# Patient Record
Sex: Male | Born: 1990 | Race: White | Hispanic: No | Smoking: Never smoker
Health system: Southern US, Community
[De-identification: ages and names within clinical notes are randomized; demographics above are authoritative.]

## PROBLEM LIST (undated history)

## (undated) DIAGNOSIS — D689 Coagulation defect, unspecified: Secondary | ICD-10-CM

## (undated) DIAGNOSIS — I10 Essential (primary) hypertension: Secondary | ICD-10-CM

## (undated) DIAGNOSIS — I2699 Other pulmonary embolism without acute cor pulmonale: Secondary | ICD-10-CM

## (undated) HISTORY — DX: Essential (primary) hypertension: I10

## (undated) HISTORY — DX: Other pulmonary embolism without acute cor pulmonale: I26.99

## (undated) HISTORY — PX: WISDOM TOOTH EXTRACTION: SHX21

## (undated) HISTORY — DX: Coagulation defect, unspecified: D68.9

---

## 2018-12-22 ENCOUNTER — Other Ambulatory Visit: Payer: Self-pay

## 2018-12-22 ENCOUNTER — Inpatient Hospital Stay (HOSPITAL_COMMUNITY)
Admission: EM | Admit: 2018-12-22 | Discharge: 2018-12-24 | DRG: 176 | Disposition: A | Discharge status: Home / Self Care | Payer: 59 | Attending: Internal Medicine | Admitting: Internal Medicine

## 2018-12-22 ENCOUNTER — Encounter (HOSPITAL_COMMUNITY): Payer: Self-pay | Admitting: Emergency Medicine

## 2018-12-22 ENCOUNTER — Emergency Department (HOSPITAL_COMMUNITY): Payer: 59

## 2018-12-22 DIAGNOSIS — A419 Sepsis, unspecified organism: Secondary | ICD-10-CM | POA: Diagnosis not present

## 2018-12-22 DIAGNOSIS — Z791 Long term (current) use of non-steroidal anti-inflammatories (NSAID): Secondary | ICD-10-CM

## 2018-12-22 DIAGNOSIS — Z79899 Other long term (current) drug therapy: Secondary | ICD-10-CM

## 2018-12-22 DIAGNOSIS — M546 Pain in thoracic spine: Secondary | ICD-10-CM | POA: Diagnosis present

## 2018-12-22 DIAGNOSIS — Z789 Other specified health status: Secondary | ICD-10-CM | POA: Diagnosis present

## 2018-12-22 DIAGNOSIS — I2699 Other pulmonary embolism without acute cor pulmonale: Secondary | ICD-10-CM | POA: Diagnosis present

## 2018-12-22 DIAGNOSIS — I2694 Multiple subsegmental pulmonary emboli without acute cor pulmonale: Secondary | ICD-10-CM | POA: Diagnosis not present

## 2018-12-22 DIAGNOSIS — R05 Cough: Secondary | ICD-10-CM | POA: Diagnosis not present

## 2018-12-22 DIAGNOSIS — R000 Tachycardia, unspecified: Secondary | ICD-10-CM | POA: Diagnosis present

## 2018-12-22 DIAGNOSIS — F1290 Cannabis use, unspecified, uncomplicated: Secondary | ICD-10-CM | POA: Diagnosis present

## 2018-12-22 DIAGNOSIS — J181 Lobar pneumonia, unspecified organism: Secondary | ICD-10-CM | POA: Diagnosis not present

## 2018-12-22 DIAGNOSIS — F1010 Alcohol abuse, uncomplicated: Secondary | ICD-10-CM | POA: Diagnosis present

## 2018-12-22 DIAGNOSIS — Z7289 Other problems related to lifestyle: Secondary | ICD-10-CM | POA: Diagnosis not present

## 2018-12-22 DIAGNOSIS — Z823 Family history of stroke: Secondary | ICD-10-CM

## 2018-12-22 DIAGNOSIS — M898X1 Other specified disorders of bone, shoulder: Secondary | ICD-10-CM | POA: Diagnosis present

## 2018-12-22 DIAGNOSIS — J189 Pneumonia, unspecified organism: Secondary | ICD-10-CM | POA: Diagnosis present

## 2018-12-22 LAB — CBC WITH DIFFERENTIAL/PLATELET
Abs Immature Granulocytes: 0.09 10*3/uL — ABNORMAL HIGH (ref 0.00–0.07)
BASOS PCT: 0 %
Basophils Absolute: 0 10*3/uL (ref 0.0–0.1)
Eosinophils Absolute: 0 10*3/uL (ref 0.0–0.5)
Eosinophils Relative: 0 %
HCT: 44.5 % (ref 39.0–52.0)
Hemoglobin: 14.9 g/dL (ref 13.0–17.0)
Immature Granulocytes: 1 %
Lymphocytes Relative: 11 %
Lymphs Abs: 1.6 10*3/uL (ref 0.7–4.0)
MCH: 29.7 pg (ref 26.0–34.0)
MCHC: 33.5 g/dL (ref 30.0–36.0)
MCV: 88.8 fL (ref 80.0–100.0)
Monocytes Absolute: 1.7 10*3/uL — ABNORMAL HIGH (ref 0.1–1.0)
Monocytes Relative: 12 %
Neutro Abs: 10.7 10*3/uL — ABNORMAL HIGH (ref 1.7–7.7)
Neutrophils Relative %: 76 %
Platelets: 220 10*3/uL (ref 150–400)
RBC: 5.01 MIL/uL (ref 4.22–5.81)
RDW: 12 % (ref 11.5–15.5)
WBC: 14.2 10*3/uL — AB (ref 4.0–10.5)
nRBC: 0 % (ref 0.0–0.2)

## 2018-12-22 LAB — BASIC METABOLIC PANEL
Anion gap: 13 (ref 5–15)
BUN: 22 mg/dL — ABNORMAL HIGH (ref 6–20)
CO2: 24 mmol/L (ref 22–32)
Calcium: 9.4 mg/dL (ref 8.9–10.3)
Chloride: 100 mmol/L (ref 98–111)
Creatinine, Ser: 1.01 mg/dL (ref 0.61–1.24)
GFR calc Af Amer: >60 mL/min (ref >60)
GFR calc non Af Amer: >60 mL/min (ref >60)
Glucose, Bld: 94 mg/dL (ref 70–99)
Potassium: 3.8 mmol/L (ref 3.5–5.1)
SODIUM: 137 mmol/L (ref 135–145)

## 2018-12-22 LAB — RESPIRATORY PANEL BY PCR
Adenovirus: NOT DETECTED
Bordetella pertussis: NOT DETECTED
CORONAVIRUS HKU1-RVPPCR: NOT DETECTED
Chlamydophila pneumoniae: NOT DETECTED
Coronavirus 229E: NOT DETECTED
Coronavirus NL63: NOT DETECTED
Coronavirus OC43: NOT DETECTED
Influenza A: NOT DETECTED
Influenza B: NOT DETECTED
METAPNEUMOVIRUS-RVPPCR: NOT DETECTED
Mycoplasma pneumoniae: NOT DETECTED
PARAINFLUENZA VIRUS 1-RVPPCR: NOT DETECTED
PARAINFLUENZA VIRUS 2-RVPPCR: NOT DETECTED
Parainfluenza Virus 3: NOT DETECTED
Parainfluenza Virus 4: NOT DETECTED
Respiratory Syncytial Virus: NOT DETECTED
Rhinovirus / Enterovirus: NOT DETECTED

## 2018-12-22 LAB — EXPECTORATED SPUTUM ASSESSMENT W GRAM STAIN, RFLX TO RESP C

## 2018-12-22 LAB — RAPID URINE DRUG SCREEN, HOSP PERFORMED
AMPHETAMINES: NOT DETECTED
Barbiturates: NOT DETECTED
Benzodiazepines: NOT DETECTED
Cocaine: NOT DETECTED
Opiates: POSITIVE — AB
Tetrahydrocannabinol: POSITIVE — AB

## 2018-12-22 LAB — TROPONIN I: Troponin I: <0.03 ng/mL (ref <0.03)

## 2018-12-22 LAB — APTT: aPTT: 28 seconds (ref 24–36)

## 2018-12-22 LAB — SEDIMENTATION RATE: Sed Rate: 25 mm/hr — ABNORMAL HIGH (ref 0–16)

## 2018-12-22 LAB — MRSA PCR SCREENING: MRSA BY PCR: NEGATIVE

## 2018-12-22 LAB — C-REACTIVE PROTEIN: CRP: 26.4 mg/dL — ABNORMAL HIGH (ref <1.0)

## 2018-12-22 LAB — HEPARIN LEVEL (UNFRACTIONATED): Heparin Unfractionated: 0.21 IU/mL — ABNORMAL LOW (ref 0.30–0.70)

## 2018-12-22 LAB — PROTIME-INR
INR: 1.1 (ref 0.8–1.2)
Prothrombin Time: 13.6 seconds (ref 11.4–15.2)

## 2018-12-22 LAB — TSH: TSH: 3.551 u[IU]/mL (ref 0.350–4.500)

## 2018-12-22 LAB — PROCALCITONIN: Procalcitonin: <0.1 ng/mL

## 2018-12-22 LAB — BRAIN NATRIURETIC PEPTIDE: B Natriuretic Peptide: 12.9 pg/mL (ref 0.0–100.0)

## 2018-12-22 MED ORDER — LEVALBUTEROL HCL 0.63 MG/3ML IN NEBU
0.6300 mg | INHALATION_SOLUTION | Freq: Four times a day (QID) | RESPIRATORY_TRACT | Status: DC
  Administered 2018-12-22: 0.63 mg via RESPIRATORY_TRACT
  Filled 2018-12-22: qty 3

## 2018-12-22 MED ORDER — HEPARIN (PORCINE) 25000 UT/250ML-% IV SOLN
1650.0000 [IU]/h | INTRAVENOUS | Status: DC
  Administered 2018-12-22: 1650 [IU]/h via INTRAVENOUS
  Filled 2018-12-22: qty 250

## 2018-12-22 MED ORDER — IPRATROPIUM-ALBUTEROL 0.5-2.5 (3) MG/3ML IN SOLN
3.0000 mL | Freq: Once | RESPIRATORY_TRACT | Status: AC
  Administered 2018-12-22: 3 mL via RESPIRATORY_TRACT
  Filled 2018-12-22: qty 3

## 2018-12-22 MED ORDER — BENZONATATE 100 MG PO CAPS: 100.0000 mg | ORAL_CAPSULE | Freq: Three times a day (TID) | ORAL | Status: DC | PRN

## 2018-12-22 MED ORDER — LORAZEPAM 1 MG PO TABS: 1.0000 mg | ORAL_TABLET | Freq: Four times a day (QID) | ORAL | Status: DC | PRN

## 2018-12-22 MED ORDER — DM-GUAIFENESIN ER 30-600 MG PO TB12
1.0000 | ORAL_TABLET | Freq: Two times a day (BID) | ORAL | Status: DC
  Administered 2018-12-22 – 2018-12-24 (×4): 1 via ORAL
  Filled 2018-12-22 (×4): qty 1

## 2018-12-22 MED ORDER — HEPARIN BOLUS VIA INFUSION
2550.0000 [IU] | Freq: Once | INTRAVENOUS | Status: AC
  Administered 2018-12-22: 2550 [IU] via INTRAVENOUS
  Filled 2018-12-22: qty 2550

## 2018-12-22 MED ORDER — IPRATROPIUM BROMIDE 0.02 % IN SOLN
0.5000 mg | Freq: Four times a day (QID) | RESPIRATORY_TRACT | Status: DC
  Administered 2018-12-22: 0.5 mg via RESPIRATORY_TRACT
  Filled 2018-12-22: qty 2.5

## 2018-12-22 MED ORDER — MORPHINE SULFATE (PF) 4 MG/ML IV SOLN
4.0000 mg | INTRAVENOUS | Status: DC | PRN
  Administered 2018-12-22: 4 mg via INTRAVENOUS
  Filled 2018-12-22: qty 1

## 2018-12-22 MED ORDER — FOLIC ACID 1 MG PO TABS
1.0000 mg | ORAL_TABLET | Freq: Every day | ORAL | Status: DC
  Administered 2018-12-22 – 2018-12-24 (×3): 1 mg via ORAL
  Filled 2018-12-22 (×3): qty 1

## 2018-12-22 MED ORDER — SODIUM CHLORIDE 0.9 % IV SOLN
1.0000 g | INTRAVENOUS | Status: DC
  Filled 2018-12-22: qty 10

## 2018-12-22 MED ORDER — LEVALBUTEROL HCL 0.63 MG/3ML IN NEBU: 0.6300 mg | INHALATION_SOLUTION | Freq: Four times a day (QID) | RESPIRATORY_TRACT | Status: DC | PRN

## 2018-12-22 MED ORDER — LORAZEPAM 2 MG/ML IJ SOLN: 1.0000 mg | Freq: Four times a day (QID) | INTRAMUSCULAR | Status: DC | PRN

## 2018-12-22 MED ORDER — DOXYCYCLINE HYCLATE 100 MG PO TABS
100.0000 mg | ORAL_TABLET | Freq: Two times a day (BID) | ORAL | Status: DC
  Filled 2018-12-22: qty 1

## 2018-12-22 MED ORDER — ONDANSETRON HCL 4 MG/2ML IJ SOLN: 4.0000 mg | Freq: Four times a day (QID) | INTRAMUSCULAR | Status: DC | PRN

## 2018-12-22 MED ORDER — SODIUM CHLORIDE (PF) 0.9 % IJ SOLN
INTRAMUSCULAR | Status: AC
  Filled 2018-12-22: qty 50

## 2018-12-22 MED ORDER — IOHEXOL 350 MG/ML SOLN
100.0000 mL | Freq: Once | INTRAVENOUS | Status: AC | PRN
  Administered 2018-12-22: 100 mL via INTRAVENOUS

## 2018-12-22 MED ORDER — ADULT MULTIVITAMIN W/MINERALS CH
1.0000 | ORAL_TABLET | Freq: Every day | ORAL | Status: DC
  Administered 2018-12-22 – 2018-12-24 (×3): 1 via ORAL
  Filled 2018-12-22 (×3): qty 1

## 2018-12-22 MED ORDER — SODIUM CHLORIDE 0.9 % IV SOLN
INTRAVENOUS | Status: DC
  Administered 2018-12-22 – 2018-12-24 (×5): via INTRAVENOUS

## 2018-12-22 MED ORDER — SODIUM CHLORIDE 0.9 % IV SOLN
500.0000 mg | INTRAVENOUS | Status: DC
  Filled 2018-12-22: qty 500

## 2018-12-22 MED ORDER — VITAMIN B-1 100 MG PO TABS
100.0000 mg | ORAL_TABLET | Freq: Every day | ORAL | Status: DC
  Administered 2018-12-22 – 2018-12-24 (×3): 100 mg via ORAL
  Filled 2018-12-22 (×3): qty 1

## 2018-12-22 MED ORDER — LORAZEPAM 2 MG/ML IJ SOLN: 0.0000 mg | Freq: Two times a day (BID) | INTRAMUSCULAR | Status: DC

## 2018-12-22 MED ORDER — TRAMADOL HCL 50 MG PO TABS
50.0000 mg | ORAL_TABLET | Freq: Four times a day (QID) | ORAL | Status: DC | PRN
  Administered 2018-12-22: 50 mg via ORAL
  Filled 2018-12-22 (×2): qty 1

## 2018-12-22 MED ORDER — THIAMINE HCL 100 MG/ML IJ SOLN: 100.0000 mg | Freq: Every day | INTRAMUSCULAR | Status: DC

## 2018-12-22 MED ORDER — SODIUM CHLORIDE 0.9 % IV SOLN
1.0000 g | INTRAVENOUS | Status: DC
  Administered 2018-12-22: 1 g via INTRAVENOUS
  Filled 2018-12-22: qty 10

## 2018-12-22 MED ORDER — LORAZEPAM 2 MG/ML IJ SOLN: 0.0000 mg | Freq: Four times a day (QID) | INTRAMUSCULAR | Status: DC

## 2018-12-22 MED ORDER — LEVALBUTEROL HCL 0.63 MG/3ML IN NEBU
0.6300 mg | INHALATION_SOLUTION | Freq: Three times a day (TID) | RESPIRATORY_TRACT | Status: DC
  Administered 2018-12-23 (×3): 0.63 mg via RESPIRATORY_TRACT
  Filled 2018-12-22 (×3): qty 3

## 2018-12-22 MED ORDER — ACETAMINOPHEN 325 MG PO TABS
650.0000 mg | ORAL_TABLET | Freq: Four times a day (QID) | ORAL | Status: DC | PRN
  Administered 2018-12-23 – 2018-12-24 (×3): 650 mg via ORAL
  Filled 2018-12-22 (×3): qty 2

## 2018-12-22 MED ORDER — IPRATROPIUM BROMIDE 0.02 % IN SOLN
0.5000 mg | Freq: Three times a day (TID) | RESPIRATORY_TRACT | Status: DC
  Administered 2018-12-23 (×3): 0.5 mg via RESPIRATORY_TRACT
  Filled 2018-12-22 (×3): qty 2.5

## 2018-12-22 NOTE — H&P (Signed)
[facility] History and Physical   Patient: David Reid   PCP: Patient, No Pcp Per DOB: [birth year]   DOA: 12/22/2018   DOS: 12/22/2018   DOS: the patient was seen and examined on 12/22/2018  Patient coming from: The patient is coming from home.  Chief Complaint: cough and back pain  HPI: David Reid is a 28 y.o. male with Past medical history of alcohol binging drinking. Patient is with complaints of cough and fever and back pain. Symptoms are ongoing for last 2 weeks. Patient reports that he went to [place], [place] between February 7 and February 10.  After a few days he started having complaints of cough which was progressively worsening.  Sometime last week he went to [place] and after returning from there he started having complaints of fever and shortness of breath.  He started having complaints of fatigue and tiredness as well. He mentions that since Thursday he started having complaints of back pain which is progressively worsening. Patient was unable to walk around on Saturday due to severe pain. On the morning of 12/22/2018 patient had a temperature of 102.1. At which time with worsening of severe back pain and shortness of breath and cough patient was brought to the urgent care. After performing a chest x-ray patient was brought here for further work-up. At the time of my evaluation he reports continues to have shortness of breath, back pain, nausea, fatigue, dizziness.  No diarrhea reported. Patient denies any smoking history, no vaping history. Patient does report that he has in the past been binge drinking but not on a daily basis but since this new year he has not drink heavily.  ED Course: Patient presents with above complaint.  Due to his recent travel history and complaints of back pain a CT chest PE protocol was performed which is equivocal. But it does show presence of left sided pneumonia and patient was referred for admission.  At his  baseline ambulates without support And is independent for most of his ADL; manages his medication on his own.  Review of Systems: as mentioned in the history of present illness.  All other systems reviewed and are negative.  History reviewed. No pertinent past medical history. Past Surgical History:  Procedure Laterality Date  . WISDOM TOOTH EXTRACTION     Social History:  reports that he has never smoked. He has never used smokeless tobacco. He reports current alcohol use. No history on file for drug.  No Known Allergies  No family history on file.  Patient does not have any significant family history.   Prior to Admission medications   Medication Sig Start Date End Date Taking? Authorizing Provider  acetaminophen (TYLENOL) 500 MG tablet Take 1,000 mg by mouth daily as needed for fever.   Yes [provider]  ibuprofen (ADVIL,MOTRIN) 200 MG tablet Take 400 mg by mouth daily as needed for fever.   Yes [provider]  Multiple Vitamin (MULTIVITAMIN WITH MINERALS) TABS tablet Take 1 tablet by mouth daily.   Yes [provider]    Physical Exam: Vitals:   12/22/18 1730 12/22/18 1800 12/22/18 1900 12/22/18 1936  BP: (!) 148/91 (!) 146/89 (!) 144/93 (!) 167/88  Pulse: (!) 118 (!) 115 (!) 115 (!) 122  Resp: (!) 37 (!) 27 (!) 24 (!) 22  Temp:    99.5 F (37.5 C)  TempSrc:    Oral  SpO2: 95% 96% 94% 97%  Weight:  Height:        General: Alert, Awake and Oriented to Time, Place and Person. Appear in severe distress, affect appropriate Eyes: PERRL, Conjunctiva normal ENT: Oral Mucosa clear moist. Neck: no JVD, no Abnormal Mass Or lumps Cardiovascular: S1 and S2 Present, no Murmur, Peripheral Pulses Present Respiratory: increased respiratory effort, Bilateral Air entry equal and Decreased, no use of accessory muscle, bilateral  Crackles, no wheezes Abdomen: Bowel Sound present, Soft and no tenderness, no hernia Skin: no redness, no Rash, nono  induration Extremities: no Pedal edema, no calf tenderness Neurologic: Grossly no focal neuro deficit. Bilaterally Equal motor strength  Labs on Admission:  CBC: Recent Labs  Lab 12/22/18 1206  WBC 14.2*  NEUTROABS 10.7*  HGB 14.9  HCT 44.5  MCV 88.8  PLT 063   Basic Metabolic Panel: Recent Labs  Lab 12/22/18 1206  NA 137  K 3.8  CL 100  CO2 24  GLUCOSE 94  BUN 22*  CREATININE 1.01  CALCIUM 9.4   GFR: Estimated Creatinine Clearance: 127.8 mL/min (by C-G formula based on SCr of 1.01 mg/dL). Liver Function Tests: No results for input(s): AST, ALT, ALKPHOS, BILITOT, PROT, ALBUMIN in the last 168 hours. No results for input(s): LIPASE, AMYLASE in the last 168 hours. No results for input(s): AMMONIA in the last 168 hours. Coagulation Profile: Recent Labs  Lab 12/22/18 1608  INR 1.1   Cardiac Enzymes: Recent Labs  Lab 12/22/18 1206  TROPONINI <0.03   BNP (last 3 results) No results for input(s): PROBNP in the last 8760 hours. HbA1C: No results for input(s): HGBA1C in the last 72 hours. CBG: No results for input(s): GLUCAP in the last 168 hours. Lipid Profile: No results for input(s): CHOL, HDL, LDLCALC, TRIG, CHOLHDL, LDLDIRECT in the last 72 hours. Thyroid Function Tests: No results for input(s): TSH, T4TOTAL, FREET4, T3FREE, THYROIDAB in the last 72 hours. Anemia Panel: No results for input(s): VITAMINB12, FOLATE, FERRITIN, TIBC, IRON, RETICCTPCT in the last 72 hours. Urine analysis: No results found for: COLORURINE, APPEARANCEUR, LABSPEC, PHURINE, GLUCOSEU, HGBUR, BILIRUBINUR, KETONESUR, PROTEINUR, UROBILINOGEN, NITRITE, LEUKOCYTESUR  Radiological Exams on Admission: Ct Angio Chest Pe W/cm &/or Wo Cm  Result Date: 12/22/2018 CLINICAL DATA:  28 year old male with history of persistent cough for the past 3 weeks. Shortness of breath worsening over the past 2 days. EXAM: CT ANGIOGRAPHY CHEST WITH CONTRAST TECHNIQUE: Multidetector CT imaging of the chest was  performed using the standard protocol during bolus administration of intravenous contrast. Multiplanar CT image reconstructions and MIPs were obtained to evaluate the vascular anatomy. CONTRAST:  128m OMNIPAQUE IOHEXOL 350 MG/ML SOLN COMPARISON:  No priors. FINDINGS: Comment: Today's study is severely limited by a large amount of patient respiratory motion. Cardiovascular: There are potential filling defects within segmental sized pulmonary artery branches to the lower lobes of the lungs bilaterally, but assessment is severely limited by extensive patient respiratory motion. No larger central filling defects are noted. Heart size is normal. There is no significant pericardial fluid, thickening or pericardial calcification. Mediastinum/Nodes: No pathologically enlarged mediastinal or hilar lymph nodes. Esophagus is unremarkable in appearance. No axillary lymphadenopathy. Lungs/Pleura: There is a combination of airspace consolidation and ground-glass attenuation throughout the left lower lobe. Some airspace consolidation is also noted in the posterior aspect of the left upper lobe. Volume loss in the right lower lobe. Small left pleural effusion lying dependently. No definite suspicious appearing pulmonary nodules or masses are noted. Upper Abdomen: Unremarkable. Musculoskeletal: There are no aggressive appearing lytic or blastic lesions  noted in the visualized portions of the skeleton. Review of the MIP images confirms the above findings. IMPRESSION: 1. Airspace consolidation and extensive ground-glass attenuation throughout the left lower lobe, and to a lesser extent in the posterior aspect of the left upper lobe. If there are infectious symptoms, this may simply reflect pneumonia. However, there are potential filling defects in the pulmonary arteries extending to this distribution (as well as in the right lower lobe to a lesser extent) which could reflect pulmonary embolism. Unfortunately, today's study is  extremely limited by patient respiratory motion such that definitive diagnosis of pulmonary embolism is not possible. 2. Small left pleural effusion lying dependently. These results were called by telephone at the time of interpretation on 12/22/2018 at 3:41 pm to Dr. Francine Graven, who verbally acknowledged these results. Electronically Signed   By: Vinnie Langton M.D.   On: 12/22/2018 15:45   Assessment/Plan 1. CAP (community acquired pneumonia) Sepsis present on admission Presents with complaints of severe cough and shortness of breath.  Chest x-ray shows extensive groundglass opacification of the left lung zone as well as significant consolidation of left lower lobe. Tachycardic and tachypneic concerning for sepsis. We will provide aggressive IV hydration. Treat with IV ceftriaxone and azithromycin. Follow-up on cultures. Continue with nebulization, Mucinex and other supportive measures as well. We will check ANA ESR CRP to rule out any other acute abnormality which can represent a severe acute lung injury. We will also check respiratory virus pathogen panel.  2.  Suspected pulmonary embolism. Patient presents with tachycardia and and back pain. CT scan of the chest is equivocal about filling defect. Patient is unable to tolerate CT PE protocol right now. Would like to repeat CT evaluation to rule out PE. Currently empirically on heparin. We will check lower externally Doppler as well as echocardiogram. D-dimer will be falsely elevated.  3.  History of alcohol abuse. Patient does have significant history of alcohol abuse. Does not think the patient is actually does not appear to be having any withdrawals right now. Last drink was last week. We will monitor him on CIWA protocol.  Nutrition: regular diet DVT Prophylaxis: subcutaneous Heparin  Advance goals of care discussion: full code   Consults: none  Family Communication: family was present at bedside, at the time of  interview.  Opportunity was given to ask question and all questions were answered satisfactorily.  Disposition: Admitted as inpatient, telemetry unit. Likely to be discharged home, in 3-4 days.  Author: Berle Mull, MD [facility] 12/22/2018  To reach On-call, see care teams to locate the attending and reach out to them via [email or web address]. If 7PM-7AM, please contact night-coverage If you still have difficulty reaching the attending provider, please page the [facility] (Director on Call) for [facility] on amion for assistance.

## 2018-12-22 NOTE — ED Provider Notes (Signed)
[facility]-EMERGENCY DEPT Provider Note   CSN: [id number] Arrival date & time: 12/22/18  1791    History   Chief Complaint Chief Complaint  Patient presents with  . Cough  . Back Pain  . Shoulder Pain    HPI David Reid is a 28 y.o. male.     HPI  Pt was seen at 1155. Per pt, c/o gradual onset and worsening of persistent cough for the past 2 to 3 weeks. Has been associated with home fevers to "102." Pt states he has recently travelled to [place] and [place], but his symptoms were present before his first trip, and worsened after his 2nd trip. Pt states he is now having constant thoracic back "pain" for the past 2 days, which worsens with deep breath and coughing. Pt was evaluated at [facility] with CXR, then told to come to the ED for further evaluation. Denies CP/palpitations, no abd pain, no N/V/D, no rash, no injury, no calf/LE pain or unilateral swelling.    History reviewed. No pertinent past medical history.  There are no active problems to display for this patient.   Past Surgical History:  Procedure Laterality Date  . WISDOM TOOTH EXTRACTION          Home Medications    Prior to Admission medications   Medication Sig Start Date End Date Taking? Authorizing Provider  acetaminophen (TYLENOL) 500 MG tablet Take 1,000 mg by mouth daily as needed for fever.   Yes [provider]  ibuprofen (ADVIL,MOTRIN) 200 MG tablet Take 400 mg by mouth daily as needed for fever.   Yes [provider]  Multiple Vitamin (MULTIVITAMIN WITH MINERALS) TABS tablet Take 1 tablet by mouth daily.   Yes [provider]    Family History No family history on file.  Social History Social History   Tobacco Use  . Smoking status: Never Smoker  . Smokeless tobacco: Never Used  Substance Use Topics  . Alcohol use: Yes  . Drug use: Not on file     Allergies   Patient has no known allergies.   Review of Systems Review of  Systems ROS: Statement: All systems negative except as marked or noted in the HPI; Constitutional: +fever and chills. ; ; Eyes: Negative for eye pain, redness and discharge. ; ; ENMT: Negative for ear pain, hoarseness, nasal congestion, sinus pressure and sore throat. ; ; Cardiovascular: Negative for chest pain, palpitations, diaphoresis, dyspnea and peripheral edema. ; ; Respiratory: +cough. Negative for wheezing and stridor. ; ; Gastrointestinal: Negative for nausea, vomiting, diarrhea, abdominal pain, blood in stool, hematemesis, jaundice and rectal bleeding. . ; ; Genitourinary: Negative for dysuria, flank pain and hematuria. ; ; Musculoskeletal: +back pain. Negative for neck pain. Negative for swelling and trauma.; ; Skin: Negative for pruritus, rash, abrasions, blisters, bruising and skin lesion.; ; Neuro: Negative for headache, lightheadedness and neck stiffness. Negative for weakness, altered level of consciousness, altered mental status, extremity weakness, paresthesias, involuntary movement, seizure and syncope.       Physical Exam Updated Vital Signs BP (!) 144/91 (BP Location: Left Arm)   Pulse (!) 108   Temp 99.6 F (37.6 C) (Oral)   Resp 14   SpO2 100%    Patient Vitals for the past 24 hrs:  BP Temp Temp src Pulse Resp SpO2  12/22/18 1441 (!) 144/91 99.6 F (37.6 C) Oral - 14 -  12/22/18 1005 (!) 160/95 98.2 F (36.8 C) Oral (!) 108 19 100 %  Physical Exam 1200: Physical examination:  Nursing notes reviewed; Vital signs and O2 SAT reviewed;  Constitutional: Well developed, Well nourished, Well hydrated, Uncomfortable appearing; Head:  Normocephalic, atraumatic; Eyes: EOMI, PERRL, No scleral icterus; ENMT: Mouth and pharynx normal, Mucous membranes moist; Neck: Supple, Full range of motion, No lymphadenopathy; Cardiovascular: Tachycardic rate and rhythm, No gallop; Respiratory: Breath sounds clear & equal bilaterally, No wheezes.  Speaking full sentences with ease, Normal  respiratory effort/excursion; Chest: Nontender, Movement normal; Abdomen: Soft, Nontender, Nondistended, Normal bowel sounds; Genitourinary: No CVA tenderness; Spine:  No midline CS, TS, LS tenderness. +TTP bilat thoracic paraspinal muscles.;; Extremities: Peripheral pulses normal, No tenderness, No edema, No calf edema or asymmetry.; Neuro: AA&Ox3, Major CN grossly intact.  Speech clear. No gross focal motor or sensory deficits in extremities. Climbs on and off stretcher easily by himself. Gait steady..; Skin: Color normal, Warm, Dry.     ED Treatments / Results  Labs (all labs ordered are listed, but only abnormal results are displayed)   EKG EKG Interpretation  Date/Time:  Monday December 22 2018 14:02:15 EST Ventricular Rate:  113 PR Interval:    QRS Duration: 105 QT Interval:  309 QTC Calculation: 424 R Axis:   83 Text Interpretation:  Sinus tachycardia Nonspecific T abnormalities, diffuse leads Artifact No old tracing to compare Confirmed by Samuel Jester [phone]) on 12/22/2018 2:29:24 PM   Radiology   Procedures Procedures (including critical care time)  Medications Ordered in ED Medications  sodium chloride (PF) 0.9 % injection (has no administration in time range)  ipratropium-albuterol (DUONEB) 0.5-2.5 (3) MG/3ML nebulizer solution 3 mL (3 mLs Nebulization Given 12/22/18 1244)  iohexol (OMNIPAQUE) 350 MG/ML injection 100 mL (100 mLs Intravenous Contrast Given 12/22/18 1517)     Initial Impression / Assessment and Plan / ED Course  I have reviewed the triage vital signs and the nursing notes.  Pertinent labs & imaging results that were available during my care of the patient were reviewed by me and considered in my medical decision making (see chart for details).     MDM Reviewed: previous chart, nursing note and vitals Interpretation: labs, ECG and CT scan Total time providing critical care: 30-74 minutes. This excludes time spent performing separately reportable  procedures and services. Consults: admitting MD    CRITICAL CARE Performed by: Samuel Jester Total critical care time: 35 minutes Critical care time was exclusive of separately billable procedures and treating other patients. Critical care was necessary to treat or prevent imminent or life-threatening deterioration. Critical care was time spent personally by me on the following activities: development of treatment plan with patient and/or surrogate as well as nursing, discussions with consultants, evaluation of patient's response to treatment, examination of patient, obtaining history from patient or surrogate, ordering and performing treatments and interventions, ordering and review of laboratory studies, ordering and review of radiographic studies, pulse oximetry and re-evaluation of patient's condition.   Results for orders placed or performed during the hospital encounter of 12/22/18  Basic metabolic panel  Result Value Ref Range   Sodium 137 135 - 145 mmol/L   Potassium 3.8 3.5 - 5.1 mmol/L   Chloride 100 98 - 111 mmol/L   CO2 24 22 - 32 mmol/L   Glucose, Bld 94 70 - 99 mg/dL   BUN 22 (H) 6 - 20 mg/dL   Creatinine, Ser 0.76 0.61 - 1.24 mg/dL   Calcium 9.4 8.9 - 80.8 mg/dL   GFR calc non Af Amer >60 >60 mL/min   GFR  calc Af Amer >60 >60 mL/min   Anion gap 13 5 - 15  CBC with Differential  Result Value Ref Range   WBC 14.2 (H) 4.0 - 10.5 K/uL   RBC 5.01 4.22 - 5.81 MIL/uL   Hemoglobin 14.9 13.0 - 17.0 g/dL   HCT 09.6 04.5 - 40.9 %   MCV 88.8 80.0 - 100.0 fL   MCH 29.7 26.0 - 34.0 pg   MCHC 33.5 30.0 - 36.0 g/dL   RDW 81.1 91.4 - 78.2 %   Platelets 220 150 - 400 K/uL   nRBC 0.0 0.0 - 0.2 %   Neutrophils Relative % 76 %   Neutro Abs 10.7 (H) 1.7 - 7.7 K/uL   Lymphocytes Relative 11 %   Lymphs Abs 1.6 0.7 - 4.0 K/uL   Monocytes Relative 12 %   Monocytes Absolute 1.7 (H) 0.1 - 1.0 K/uL   Eosinophils Relative 0 %   Eosinophils Absolute 0.0 0.0 - 0.5 K/uL   Basophils  Relative 0 %   Basophils Absolute 0.0 0.0 - 0.1 K/uL   Immature Granulocytes 1 %   Abs Immature Granulocytes 0.09 (H) 0.00 - 0.07 K/uL  Troponin I - Once  Result Value Ref Range   Troponin I <0.03 <0.03 ng/mL   Ct Angio Chest Pe W/cm &/or Wo Cm Result Date: 12/22/2018 CLINICAL DATA:  28 year old male with history of persistent cough for the past 3 weeks. Shortness of breath worsening over the past 2 days. EXAM: CT ANGIOGRAPHY CHEST WITH CONTRAST TECHNIQUE: Multidetector CT imaging of the chest was performed using the standard protocol during bolus administration of intravenous contrast. Multiplanar CT image reconstructions and MIPs were obtained to evaluate the vascular anatomy. CONTRAST:  OMNIPAQUE IOHEXOL 350 MG/ML SOLN COMPARISON:  No priors. FINDINGS: Comment: Today's study is severely limited by a large amount of patient respiratory motion. Cardiovascular: There are potential filling defects within segmental sized pulmonary artery branches to the lower lobes of the lungs bilaterally, but assessment is severely limited by extensive patient respiratory motion. No larger central filling defects are noted. Heart size is normal. There is no significant pericardial fluid, thickening or pericardial calcification. Mediastinum/Nodes: No pathologically enlarged mediastinal or hilar lymph nodes. Esophagus is unremarkable in appearance. No axillary lymphadenopathy. Lungs/Pleura: There is a combination of airspace consolidation and ground-glass attenuation throughout the left lower lobe. Some airspace consolidation is also noted in the posterior aspect of the left upper lobe. Volume loss in the right lower lobe. Small left pleural effusion lying dependently. No definite suspicious appearing pulmonary nodules or masses are noted. Upper Abdomen: Unremarkable. Musculoskeletal: There are no aggressive appearing lytic or blastic lesions noted in the visualized portions of the skeleton. Review of the MIP images  confirms the above findings. IMPRESSION: 1. Airspace consolidation and extensive ground-glass attenuation throughout the left lower lobe, and to a lesser extent in the posterior aspect of the left upper lobe. If there are infectious symptoms, this may simply reflect pneumonia. However, there are potential filling defects in the pulmonary arteries extending to this distribution (as well as in the right lower lobe to a lesser extent) which could reflect pulmonary embolism. Unfortunately, today's study is extremely limited by patient respiratory motion such that definitive diagnosis of pulmonary embolism is not possible. 2. Small left pleural effusion lying dependently. These results were called by telephone at the time of interpretation on 12/22/2018 at 3:41 pm to Dr. Samuel Jester, who verbally acknowledged these results. Electronically Signed   By: Trudie Reed  M.D.   On: 12/22/2018 15:45     1610:  CXR at [facility] with "elevated left hemidiaphragm" and pt sent to ED with expectations of obtaining CT scan.  Pt with risk factors for PE for CT-A obtained and is as above.  Will start IV heparin and IV abx. Pt will need admission and repeat CT-A tomorrow to r/o PE. Pt continues tachycardic and unable to take deep breath even after short neb given. Dx and testing d/w pt and family.  Questions answered.  Verb understanding, agreeable to admit.  T/C returned from [facility]. Allena Katz, case discussed, including:  HPI, pertinent PM/SHx, VS/PE, dx testing, ED course and treatment:  Agreeable to come to ED for evaluation for admission.     Final Clinical Impressions(s) / ED Diagnoses   Final diagnoses:  None    ED Discharge Orders    None       Samuel Jester, DO 12/27/18 1728

## 2018-12-22 NOTE — ED Notes (Signed)
ED TO INPATIENT HANDOFF REPORT  ED Nurse Name and Phone #: [phone]   S Name/Age/Gender David Reid 28 y.o. male Room/Bed: WA02/WA02  Code Status   Code Status: Full Code  Home/SNF/Other Home Patient oriented to: self, place, time and situation Is this baseline? Yes   Triage Complete: Triage complete  Chief Complaint Rib/Chest Pain; Sent from Urgent  Triage Note Pt reports had productive cough, fever, congestion for few weeks. Pt c/o back and shoulder pain started on Saturday that is worse with coughing or taking deep breaths.    Allergies No Known Allergies  Level of Care/Admitting Diagnosis ED Disposition    ED Disposition Condition Comment   Admit  Hospital Area: [facility] [[id number]]  Level of Care: Telemetry [5]  Admit to tele based on following criteria: Complex arrhythmia (Bradycardia/Tachycardia)  Diagnosis: CAP (community acquired pneumonia) [[id number]]  Admitting Physician: Rolly Salter [[id number]]  Attending Physician: Rolly Salter [[id number]]  Estimated length of stay: past midnight tomorrow  Certification:: I certify this patient will need inpatient services for at least 2 midnights  PT Class (Do Not Modify): Inpatient [101]  PT Acc Code (Do Not Modify): Private [1]       B Medical/Surgery History History reviewed. No pertinent past medical history. Past Surgical History:  Procedure Laterality Date  . WISDOM TOOTH EXTRACTION       A IV Location/Drains/Wounds Patient Lines/Drains/Airways Status   Active Line/Drains/Airways    Name:   Placement date:   Placement time:   Site:   Days:   Peripheral IV 12/22/18 Right Antecubital   12/22/18    1230    Antecubital   less than 1   Peripheral IV 12/22/18 Right Forearm   12/22/18    1630    Forearm   less than 1          Intake/Output Last 24 hours  Intake/Output Summary (Last 24 hours) at 12/22/2018 1837 Last data filed at 12/22/2018 1809 Gross per 24 hour  Intake 100 ml   Output -  Net 100 ml    Labs/Imaging Results for orders placed or performed during the hospital encounter of 12/22/18 (from the past 48 hour(s))  Basic metabolic panel     Status: Abnormal   Collection Time: 12/22/18 12:06 PM  Result Value Ref Range   Sodium 137 135 - 145 mmol/L   Potassium 3.8 3.5 - 5.1 mmol/L   Chloride 100 98 - 111 mmol/L   CO2 24 22 - 32 mmol/L   Glucose, Bld 94 70 - 99 mg/dL   BUN 22 (H) 6 - 20 mg/dL   Creatinine, Ser 0.96 0.61 - 1.24 mg/dL   Calcium 9.4 8.9 - 28.3 mg/dL   GFR calc non Af Amer >60 >60 mL/min   GFR calc Af Amer >60 >60 mL/min   Anion gap 13 5 - 15    Comment: Performed at [facility], [address] [place], [place] [address]  CBC with Differential     Status: Abnormal   Collection Time: 12/22/18 12:06 PM  Result Value Ref Range   WBC 14.2 (H) 4.0 - 10.5 K/uL   RBC 5.01 4.22 - 5.81 MIL/uL   Hemoglobin 14.9 13.0 - 17.0 g/dL   HCT 76.5 46.5 - 03.5 %   MCV 88.8 80.0 - 100.0 fL   MCH 29.7 26.0 - 34.0 pg   MCHC 33.5 30.0 - 36.0 g/dL   RDW 46.5 68.1 - 27.5 %  Platelets 220 150 - 400 K/uL   nRBC 0.0 0.0 - 0.2 %   Neutrophils Relative % 76 %   Neutro Abs 10.7 (H) 1.7 - 7.7 K/uL   Lymphocytes Relative 11 %   Lymphs Abs 1.6 0.7 - 4.0 K/uL   Monocytes Relative 12 %   Monocytes Absolute 1.7 (H) 0.1 - 1.0 K/uL   Eosinophils Relative 0 %   Eosinophils Absolute 0.0 0.0 - 0.5 K/uL   Basophils Relative 0 %   Basophils Absolute 0.0 0.0 - 0.1 K/uL   Immature Granulocytes 1 %   Abs Immature Granulocytes 0.09 (H) 0.00 - 0.07 K/uL    Comment: Performed at [facility], [address] [place], [place] [address]  Troponin I - Once     Status: None   Collection Time: 12/22/18 12:06 PM  Result Value Ref Range   Troponin I <0.03 <0.03 ng/mL    Comment: Performed at [facility], [address], [place], [place] [address]   Ct Angio Chest Pe W/cm &/or Wo Cm  Result Date: 12/22/2018 CLINICAL  DATA:  28 year old male with history of persistent cough for the past 3 weeks. Shortness of breath worsening over the past 2 days. EXAM: CT ANGIOGRAPHY CHEST WITH CONTRAST TECHNIQUE: Multidetector CT imaging of the chest was performed using the standard protocol during bolus administration of intravenous contrast. Multiplanar CT image reconstructions and MIPs were obtained to evaluate the vascular anatomy. CONTRAST:  OMNIPAQUE IOHEXOL 350 MG/ML SOLN COMPARISON:  No priors. FINDINGS: Comment: Today's study is severely limited by a large amount of patient respiratory motion. Cardiovascular: There are potential filling defects within segmental sized pulmonary artery branches to the lower lobes of the lungs bilaterally, but assessment is severely limited by extensive patient respiratory motion. No larger central filling defects are noted. Heart size is normal. There is no significant pericardial fluid, thickening or pericardial calcification. Mediastinum/Nodes: No pathologically enlarged mediastinal or hilar lymph nodes. Esophagus is unremarkable in appearance. No axillary lymphadenopathy. Lungs/Pleura: There is a combination of airspace consolidation and ground-glass attenuation throughout the left lower lobe. Some airspace consolidation is also noted in the posterior aspect of the left upper lobe. Volume loss in the right lower lobe. Small left pleural effusion lying dependently. No definite suspicious appearing pulmonary nodules or masses are noted. Upper Abdomen: Unremarkable. Musculoskeletal: There are no aggressive appearing lytic or blastic lesions noted in the visualized portions of the skeleton. Review of the MIP images confirms the above findings. IMPRESSION: 1. Airspace consolidation and extensive ground-glass attenuation throughout the left lower lobe, and to a lesser extent in the posterior aspect of the left upper lobe. If there are infectious symptoms, this may simply reflect pneumonia. However,  there are potential filling defects in the pulmonary arteries extending to this distribution (as well as in the right lower lobe to a lesser extent) which could reflect pulmonary embolism. Unfortunately, today's study is extremely limited by patient respiratory motion such that definitive diagnosis of pulmonary embolism is not possible. 2. Small left pleural effusion lying dependently. These results were called by telephone at the time of interpretation on 12/22/2018 at 3:41 pm to Dr. Samuel Jester, who verbally acknowledged these results. Electronically Signed   By: Trudie Reed M.D.   On: 12/22/2018 15:45    Pending Labs Unresulted Labs (From admission, onward)    Start     Ordered   12/23/18 0500  Procalcitonin  Daily,   R     12/22/18 1743  12/23/18 0500  CBC  Daily,   R     12/22/18 1810   12/22/18 2330  Heparin level (unfractionated)  Once-Timed,   R     12/22/18 1810   12/22/18 1743  MRSA PCR Screening  Once,   R     12/22/18 1743   12/22/18 1743  Urine rapid drug screen (hosp performed)  ONCE - STAT,   R     12/22/18 1743   12/22/18 1743  ANA, IFA (with reflex)  Once,   R     12/22/18 1743   12/22/18 1743  Sedimentation rate  Once,   R     12/22/18 1743   12/22/18 1743  C-reactive protein  Once,   R     12/22/18 1743   12/22/18 1743  Procalcitonin - Baseline  ONCE - STAT,   STAT     12/22/18 1743   12/22/18 1739  TSH  Once,   R     12/22/18 1743   12/22/18 1739  Brain natriuretic peptide  Once,   R     12/22/18 1743   12/22/18 1723  Culture, sputum-assessment  Once,   R     12/22/18 1743   12/22/18 1723  Gram stain  Once,   R     12/22/18 1743   12/22/18 1723  HIV antibody (Routine Screening)  Once,   R     12/22/18 1743   12/22/18 1723  Strep pneumoniae urinary antigen  Once,   R     12/22/18 1743   12/22/18 1632  Respiratory Panel by PCR  (Respiratory virus panel with precautions)  Once,   R     12/22/18 1632   12/22/18 1608  Protime-INR  ONCE - STAT,   R      12/22/18 1608   12/22/18 1608  APTT  ONCE - STAT,   R     12/22/18 1608   12/22/18 1552  Culture, blood (routine x 2) Call MD if unable to obtain prior to antibiotics being given  BLOOD CULTURE X 2,   R    Comments:  If blood cultures drawn in Emergency Department - Do not draw and cancel order    12/22/18 1551          Vitals/Pain Today's Vitals   12/22/18 1441 12/22/18 1639 12/22/18 1708 12/22/18 1721  BP: (!) 144/91  (!) 159/79   Pulse:   (!) 121   Resp: 14  (!) 24   Temp: 99.6 F (37.6 C)     TempSrc: Oral     SpO2:   94%   Weight:  99.8 kg    Height:  5\' 10"  (1.778 m)    PainSc:    5     Isolation Precautions Droplet precaution  Medications Medications  sodium chloride (PF) 0.9 % injection (has no administration in time range)  heparin bolus via infusion 2,550 Units (2,550 Units Intravenous Bolus from Bag 12/22/18 1723)    Followed by  heparin ADULT infusion 100 units/mL (25000 units/230mL sodium chloride 0.45%) (1,650 Units/hr Intravenous New Bag/Given 12/22/18 1723)  0.9 %  sodium chloride infusion (has no administration in time range)  ipratropium (ATROVENT) nebulizer solution 0.5 mg (has no administration in time range)  levalbuterol (XOPENEX) nebulizer solution 0.63 mg (has no administration in time range)  dextromethorphan-guaiFENesin (MUCINEX DM) 30-600 MG per 12 hr tablet 1 tablet (has no administration in time range)  benzonatate (TESSALON) capsule 100 mg (has no administration in  time range)  traMADol (ULTRAM) tablet 50 mg (has no administration in time range)  ondansetron (ZOFRAN) injection 4 mg (has no administration in time range)  acetaminophen (TYLENOL) tablet 650 mg (has no administration in time range)  cefTRIAXone (ROCEPHIN) 1 g in sodium chloride 0.9 % 100 mL IVPB (has no administration in time range)  azithromycin (ZITHROMAX) 500 mg in sodium chloride 0.9 % 250 mL IVPB (has no administration in time range)  ipratropium-albuterol (DUONEB) 0.5-2.5 (3)  MG/3ML nebulizer solution 3 mL (3 mLs Nebulization Given 12/22/18 1244)  iohexol (OMNIPAQUE) 350 MG/ML injection 100 mL (100 mLs Intravenous Contrast Given 12/22/18 1517)    Mobility walks Low fall risk   Focused Assessments Cardiac Assessment Handoff:    Lab Results  Component Value Date   TROPONINI <0.03 12/22/2018   No results found for: DDIMER Does the Patient currently have chest pain? No     R Recommendations: See Admitting Provider Note  Report given to:   Additional Notes:

## 2018-12-22 NOTE — Progress Notes (Addendum)
ANTICOAGULATION CONSULT NOTE - Initial Consult  Pharmacy Consult for Heparin Indication: pulmonary embolus  No Known Allergies  Patient Measurements:   HEPARIN DW (KG): 93.8  Vital Signs: Temp: 99.6 F (37.6 C) (03/02 1441) Temp Source: Oral (03/02 1441) BP: 144/91 (03/02 1441) Pulse Rate: 108 (03/02 1005)  Labs: Recent Labs    12/22/18 1206  HGB 14.9  HCT 44.5  PLT 220  CREATININE 1.01  TROPONINI <0.03    CrCl cannot be calculated (Unknown ideal weight.).   Medical History: History reviewed. No pertinent past medical history.  Medications:  Infusions:  . cefTRIAXone (ROCEPHIN)  IV      Assessment: 28 yo M with recent travel presents with persistent cough & fevers.  Chest CT inconclusive for PNA vs PE.  Pharmacy consulted to start heparin until repeat chest CT completed to determine if patient has PE.  Baseline CBC WNL.    Goal of Therapy:  Heparin level 0.3-0.7 units/ml Monitor platelets by anticoagulation protocol: Yes   Plan:  Check baseline PT/INR, aPTT Give 2550 units bolus x 1 Start heparin infusion at 1650 units/hr Check anti-Xa level in 6 hours and daily while on heparin Continue to monitor H&H and platelets  Elson Clan 12/22/2018,4:02 PM

## 2018-12-22 NOTE — ED Triage Notes (Signed)
Pt reports had productive cough, fever, congestion for few weeks. Pt c/o back and shoulder pain started on Saturday that is worse with coughing or taking deep breaths.

## 2018-12-22 NOTE — Progress Notes (Signed)
ANTICOAGULATION CONSULT NOTE -  Consult  Pharmacy Consult for Heparin Indication: pulmonary embolus  No Known Allergies  Patient Measurements: Height: 5\' 10"  (177.8 cm) Weight: 220 lb (99.8 kg) IBW/kg (Calculated) : 73 HEPARIN DW (KG): 93.8  Vital Signs: Temp: 99.5 F (37.5 C) (03/02 1936) Temp Source: Oral (03/02 1936) BP: 141/86 (03/02 2111) Pulse Rate: 111 (03/02 2243)  Labs: Recent Labs    12/22/18 1206 12/22/18 1608 12/22/18 2319  HGB 14.9  --   --   HCT 44.5  --   --   PLT 220  --   --   APTT  --  28  --   LABPROT  --  13.6  --   INR  --  1.1  --   HEPARINUNFRC  --   --  0.21*  CREATININE 1.01  --   --   TROPONINI <0.03  --   --     Estimated Creatinine Clearance: 127.8 mL/min (by C-G formula based on SCr of 1.01 mg/dL).   Medical History: History reviewed. No pertinent past medical history.  Medications:  Infusions:  . sodium chloride 125 mL/hr at 12/22/18 1944  . azithromycin    . [START ON 12/23/2018] cefTRIAXone (ROCEPHIN)  IV    . heparin 1,650 Units/hr (12/22/18 1723)    Assessment: 28 yo M with recent travel presents with persistent cough & fevers.  Chest CT inconclusive for PNA vs PE.  Pharmacy consulted to start heparin until repeat chest CT completed to determine if patient has PE.  Baseline CBC WNL.  Today, 3/2 2319 HL = 0.21 below goal, no infusion or bleeding issues per RN.   Goal of Therapy:  Heparin level 0.3-0.7 units/ml Monitor platelets by anticoagulation protocol: Yes   Plan:  Give Heparin 2000 unit rebolus Increase heparin drip to 1900 units/hr Daily CBC and HL Check next HL in 6 hours   Lorenza Evangelist 12/22/2018,11:52 PM

## 2018-12-23 ENCOUNTER — Inpatient Hospital Stay (HOSPITAL_COMMUNITY): Payer: 59

## 2018-12-23 DIAGNOSIS — I2699 Other pulmonary embolism without acute cor pulmonale: Secondary | ICD-10-CM

## 2018-12-23 DIAGNOSIS — R000 Tachycardia, unspecified: Secondary | ICD-10-CM

## 2018-12-23 DIAGNOSIS — I2694 Multiple subsegmental pulmonary emboli without acute cor pulmonale: Secondary | ICD-10-CM

## 2018-12-23 LAB — ECHOCARDIOGRAM COMPLETE
Height: 70 in
Weight: 3520 oz

## 2018-12-23 LAB — CBC
HCT: 41.5 % (ref 39.0–52.0)
Hemoglobin: 13.5 g/dL (ref 13.0–17.0)
MCH: 29.9 pg (ref 26.0–34.0)
MCHC: 32.5 g/dL (ref 30.0–36.0)
MCV: 92 fL (ref 80.0–100.0)
PLATELETS: 228 10*3/uL (ref 150–400)
RBC: 4.51 MIL/uL (ref 4.22–5.81)
RDW: 12.1 % (ref 11.5–15.5)
WBC: 10.5 10*3/uL (ref 4.0–10.5)
nRBC: 0 % (ref 0.0–0.2)

## 2018-12-23 LAB — ANTITHROMBIN III: ANTITHROMB III FUNC: 78 % (ref 75–120)

## 2018-12-23 LAB — PROCALCITONIN: Procalcitonin: <0.1 ng/mL

## 2018-12-23 LAB — HEPARIN LEVEL (UNFRACTIONATED): Heparin Unfractionated: 0.39 IU/mL (ref 0.30–0.70)

## 2018-12-23 LAB — HIV ANTIBODY (ROUTINE TESTING W REFLEX): HIV Screen 4th Generation wRfx: NONREACTIVE

## 2018-12-23 MED ORDER — AZITHROMYCIN 250 MG PO TABS
500.0000 mg | ORAL_TABLET | Freq: Every day | ORAL | Status: DC
  Administered 2018-12-23 – 2018-12-24 (×2): 500 mg via ORAL
  Filled 2018-12-23 (×2): qty 2

## 2018-12-23 MED ORDER — HEPARIN (PORCINE) 25000 UT/250ML-% IV SOLN
1900.0000 [IU]/h | INTRAVENOUS | Status: AC
  Administered 2018-12-23 – 2018-12-24 (×3): 1900 [IU]/h via INTRAVENOUS
  Filled 2018-12-23 (×3): qty 250

## 2018-12-23 MED ORDER — IPRATROPIUM BROMIDE 0.02 % IN SOLN: 0.5000 mg | Freq: Four times a day (QID) | RESPIRATORY_TRACT | Status: DC | PRN

## 2018-12-23 MED ORDER — SODIUM CHLORIDE (PF) 0.9 % IJ SOLN
INTRAMUSCULAR | Status: AC
  Filled 2018-12-23: qty 50

## 2018-12-23 MED ORDER — HEPARIN BOLUS VIA INFUSION
2000.0000 [IU] | Freq: Once | INTRAVENOUS | Status: AC
  Administered 2018-12-23: 2000 [IU] via INTRAVENOUS
  Filled 2018-12-23: qty 2000

## 2018-12-23 MED ORDER — IOHEXOL 350 MG/ML SOLN
100.0000 mL | Freq: Once | INTRAVENOUS | Status: AC | PRN
  Administered 2018-12-23: 100 mL via INTRAVENOUS

## 2018-12-23 NOTE — Progress Notes (Signed)
  Echocardiogram 2D Echocardiogram has been performed.  Leta Jungling M 12/23/2018, 2:36 PM

## 2018-12-23 NOTE — Progress Notes (Signed)
Received report from A. Dickey, RN. No change from initial pm assessment. Will continue to monitor and follow the POC. 

## 2018-12-23 NOTE — Progress Notes (Signed)
Bilateral lower extremities venous duplex exam completed. Please see preliminary notes on CV PROC under chart review. Cecilio Asper Sonji Starkes(RDMS RVT)` 12/23/18 12:15 PM

## 2018-12-23 NOTE — Progress Notes (Signed)
ANTICOAGULATION CONSULT NOTE -  Consult  Pharmacy Consult for Heparin Indication: pulmonary embolus  No Known Allergies  Patient Measurements: Height: 5\' 10"  (177.8 cm) Weight: 220 lb (99.8 kg) IBW/kg (Calculated) : 73 HEPARIN DW (KG): 93.8  Vital Signs: Temp: 99.4 F (37.4 C) (03/03 0347) Temp Source: Oral (03/03 0301) BP: 142/88 (03/03 0701) Pulse Rate: 99 (03/03 0701)  Labs: Recent Labs    12/22/18 1206 12/22/18 1608 12/22/18 2319 12/23/18 0632  HGB 14.9  --   --  13.5  HCT 44.5  --   --  41.5  PLT 220  --   --  228  APTT  --  28  --   --   LABPROT  --  13.6  --   --   INR  --  1.1  --   --   HEPARINUNFRC  --   --  0.21* 0.39  CREATININE 1.01  --   --   --   TROPONINI <0.03  --   --   --     Estimated Creatinine Clearance: 127.8 mL/min (by C-G formula based on SCr of 1.01 mg/dL).   Assessment: 28 yo M with recent travel presents with persistent cough & fevers.  Chest CT inconclusive for PNA vs PE.  Pharmacy consulted to start heparin until repeat chest CT completed to determine if pat ient has PE.  Baseline CBC WNL.  12/23/2018 Heparin level therapeutic at 0.39 after 2000 unit bolus and rate increase to 1900 units/hr.  CBC WNL.  No bleeding reported. Repeat CT to be done today  Goal of Therapy:  Heparin level 0.3-0.7 units/ml Monitor platelets by anticoagulation protocol: Yes   Plan:  Continue heparin drip at 1900 units/hr F/u results of repeat CT- if + will order confirmatory HL Daily CBC and HL  Herby Abraham, Pharm.D [phone] 12/23/2018 7:40 AM

## 2018-12-23 NOTE — Progress Notes (Signed)
PROGRESS NOTE  David Reid BWL:[id number] DOB: [birth year] DOA: 12/22/2018 PCP: Patient, No Pcp Per   LOS: 1 day   Brief Narrative / Interim history: 28 year old male with no significant past medical history admitted with cough, dyspnea, fever and pleuritic left-sided back pain for 2 days and found to have PE and possible LLL pneumonia on CTA. Reports recent travel to [place] in [place].  Denies swelling or pain in his legs.  Reports family history of blood clot from the mother side. In ED, vital signs significant for tachycardia, tachypnea and mildly elevated blood pressure.  Saturating in mid to upper 90s on room air.   CBC significant for leukocytosis to 14.  CMP not impressive.  BNP, troponin, RVP and procalcitonin negative.  Initial CTA inconclusive for PE but concerning for LLL pneumonia.  Empirically started on heparin and IV antibiotics.  Repeat CTA chest the next morning confirmed bilateral pulmonary embolism. Lower extremity venous Doppler negative.    Subjective: No major events overnight of this morning.  Continues to complain left upper back pain that he describes as sharp.  Worse with cough on deep inhalation.  Denies hemoptysis.  Denies calf pain or tenderness.  Denies history of blood clot.  Reports family history of blood clot on the mother side.  Assessment & Plan: Principal Problem:   CAP (community acquired pneumonia) Active Problems:   Sepsis ([place])   Tachycardia   suspected Pulmonary embolism ([place])   Pain in scapula   history of Drinking binge  Bilateral PE: Risk factors include recent travel to [place] in [place].  Also family history of blood clots on the mother side.  Lower extremity venous Doppler negative. -Hemodynamically stable. No hypoxemia -Follow echocardiogram -Ambulate for oxygen requirement -Hypercoagulable labs. -Will transition to [facility] and discharge home today or tomorrow. -PRN pain meds  CAP: CT concerning for LLL infiltrate.  However,  procalcitonin negative. Finding could be related to PE -We will cover him with azithromycin -Breathing txs and mucolytics -Discontinue ceftriaxone.  Mildly elevated ESR/CRP: Could be due to the above.  HIV nonreactive. -Follow ANA and hypercoagulable labs.  Marijuana use: -Encouraged to quit.  Alcohol use disorder: -Continue CIWA protocol.  Scheduled Meds: . azithromycin  500 mg Oral Daily  . dextromethorphan-guaiFENesin  1 tablet Oral BID  . folic acid  1 mg Oral Daily  . ipratropium  0.5 mg Nebulization TID  . levalbuterol  0.63 mg Nebulization TID  . LORazepam  0-4 mg Intravenous Q6H   Followed by  . [START ON 12/24/2018] LORazepam  0-4 mg Intravenous Q12H  . multivitamin with minerals  1 tablet Oral Daily  . sodium chloride (PF)      . thiamine  100 mg Oral Daily   Continuous Infusions: . sodium chloride 125 mL/hr at 12/23/18 1313  . cefTRIAXone (ROCEPHIN)  IV    . heparin 1,900 Units/hr (12/23/18 0530)   PRN Meds:.acetaminophen, benzonatate, levalbuterol, LORazepam **OR** LORazepam, ondansetron (ZOFRAN) IV, traMADol  DVT prophylaxis: On heparin drip for PE Code Status: Full code Family Communication: None at bedside Disposition Plan: Anticipate discharge today or tomorrow after echocardiogram  Consultants:   None  Procedures:   None  Antimicrobials:  Ceftriaxone azithromycin 3 2--  Objective: Vitals:   12/23/18 0734 12/23/18 0752 12/23/18 0916 12/23/18 1339  BP: (!) 142/88   (!) 153/78  Pulse: (!) 109  (!) 102 (!) 121  Resp:    18  Temp:    100 F (37.8 C)  TempSrc:  Oral  SpO2: 92% 93%  92%  Weight:      Height:        Intake/Output Summary (Last 24 hours) at 12/23/2018 1404 Last data filed at 12/23/2018 0600 Gross per 24 hour  Intake 1244.65 ml  Output -  Net 1244.65 ml   Filed Weights   12/22/18 1639  Weight: 99.8 kg    Examination:  GENERAL: Appears well. No acute distress.  EYES - vision grossly intact. Sclera anicteric.  NOSE- no  gross deformity or drainage MOUTH - no oral lesions noted THROAT- no swelling or erythema LUNGS:  No IWOB. Good air movement. CTAB.  HEART:   RRR. Heart sounds normal. ABD: Bowel sounds present. Soft. Non tender.  MSK/EXT: Moves extremities. No obvious deformity. SKIN: no apparent skin lesion.  NEURO: Awake, alert and oriented appropriately.  No gross deficit.  PSYCH: Calm. Normal affect.   Data Reviewed: I have independently reviewed following labs and imaging studies   CBC: Recent Labs  Lab 12/22/18 1206 12/23/18 0632  WBC 14.2* 10.5  NEUTROABS 10.7*  --   HGB 14.9 13.5  HCT 44.5 41.5  MCV 88.8 92.0  PLT 220 681   Basic Metabolic Panel: Recent Labs  Lab 12/22/18 1206  NA 137  K 3.8  CL 100  CO2 24  GLUCOSE 94  BUN 22*  CREATININE 1.01  CALCIUM 9.4   GFR: Estimated Creatinine Clearance: 127.8 mL/min (by C-G formula based on SCr of 1.01 mg/dL). Liver Function Tests: No results for input(s): AST, ALT, ALKPHOS, BILITOT, PROT, ALBUMIN in the last 168 hours. No results for input(s): LIPASE, AMYLASE in the last 168 hours. No results for input(s): AMMONIA in the last 168 hours. Coagulation Profile: Recent Labs  Lab 12/22/18 1608  INR 1.1   Cardiac Enzymes: Recent Labs  Lab 12/22/18 1206  TROPONINI <0.03   BNP (last 3 results) No results for input(s): PROBNP in the last 8760 hours. HbA1C: No results for input(s): HGBA1C in the last 72 hours. CBG: No results for input(s): GLUCAP in the last 168 hours. Lipid Profile: No results for input(s): CHOL, HDL, LDLCALC, TRIG, CHOLHDL, LDLDIRECT in the last 72 hours. Thyroid Function Tests: Recent Labs    12/22/18 2012  TSH 3.551   Anemia Panel: No results for input(s): VITAMINB12, FOLATE, FERRITIN, TIBC, IRON, RETICCTPCT in the last 72 hours. Urine analysis: No results found for: COLORURINE, APPEARANCEUR, LABSPEC, PHURINE, GLUCOSEU, HGBUR, BILIRUBINUR, KETONESUR, PROTEINUR, UROBILINOGEN, NITRITE,  LEUKOCYTESUR Sepsis Labs: Invalid input(s): PROCALCITONIN, LACTICIDVEN  Recent Results (from the past 240 hour(s))  Respiratory Panel by PCR     Status: None   Collection Time: 12/22/18  4:32 PM  Result Value Ref Range Status   Adenovirus NOT DETECTED NOT DETECTED Final   Coronavirus 229E NOT DETECTED NOT DETECTED Final    Comment: (NOTE) The Coronavirus on the Respiratory Panel, DOES NOT test for the novel  Coronavirus (2019 nCoV)    Coronavirus HKU1 NOT DETECTED NOT DETECTED Final   Coronavirus NL63 NOT DETECTED NOT DETECTED Final   Coronavirus OC43 NOT DETECTED NOT DETECTED Final   Metapneumovirus NOT DETECTED NOT DETECTED Final   Rhinovirus / Enterovirus NOT DETECTED NOT DETECTED Final   Influenza A NOT DETECTED NOT DETECTED Final   Influenza B NOT DETECTED NOT DETECTED Final   Parainfluenza Virus 1 NOT DETECTED NOT DETECTED Final   Parainfluenza Virus 2 NOT DETECTED NOT DETECTED Final   Parainfluenza Virus 3 NOT DETECTED NOT DETECTED Final   Parainfluenza Virus 4 NOT  DETECTED NOT DETECTED Final   Respiratory Syncytial Virus NOT DETECTED NOT DETECTED Final   Bordetella pertussis NOT DETECTED NOT DETECTED Final   Chlamydophila pneumoniae NOT DETECTED NOT DETECTED Final   Mycoplasma pneumoniae NOT DETECTED NOT DETECTED Final    Comment: Performed at [facility], [place] [address] [place], [place] [address]  Culture, sputum-assessment     Status: None   Collection Time: 12/22/18  5:23 PM  Result Value Ref Range Status   Specimen Description EXPECTORATED SPUTUM  Final   Special Requests NONE  Final   Sputum evaluation   Final    Sputum specimen not acceptable for testing.  Please recollect.   CALLED ABBY RN [phone] 12/22/18 A NAVARRO Performed at [facility], [place] [address], [place], [place] [address]    Report Status 12/22/2018 FINAL  Final  MRSA PCR Screening     Status: None   Collection Time: 12/22/18  5:43 PM  Result Value Ref Range Status   MRSA  by PCR NEGATIVE NEGATIVE Final    Comment:        The GeneXpert MRSA Assay (FDA approved for NASAL specimens only), is one component of a comprehensive MRSA colonization surveillance program. It is not intended to diagnose MRSA infection nor to guide or monitor treatment for MRSA infections. Performed at [facility], [place] [address], [place], [place] [address]       Radiology Studies: Ct Angio Chest Pe W Or Wo Contrast  Result Date: 12/23/2018 CLINICAL DATA:  Shortness of breath and limited previous CTA EXAM: CT ANGIOGRAPHY CHEST WITH CONTRAST TECHNIQUE: Multidetector CT imaging of the chest was performed using the standard protocol during bolus administration of intravenous contrast. Multiplanar CT image reconstructions and MIPs were obtained to evaluate the vascular anatomy. CONTRAST:  126m OMNIPAQUE IOHEXOL 350 MG/ML SOLN COMPARISON:  Exam from the previous day. FINDINGS: Cardiovascular: Thoracic aorta is again within normal limits. Pulmonary artery is well visualized although the enhancement pattern is limited. Filling defects are noted in the pulmonary artery bilaterally consistent with pulmonary emboli. These are better visualized than on the previous day. No pulmonary arterial enlargement is seen. The heart is mildly prominent. No coronary calcifications are seen. Mediastinum/Nodes: Thoracic inlet is within normal limits. Lungs/Pleura: Right lung is well aerated with the exception of minimal lower lobe atelectasis. More marked infiltrate is noted in the left lower lobe similar to that seen on the prior exam with associated small effusion. No definitive nodular changes are seen. Upper Abdomen: Visualized upper abdomen is within normal limits. Musculoskeletal: Bony structures are within normal limits stable from the previous study. Review of the MIP images confirms the above findings. IMPRESSION: Changes consistent with bilateral pulmonary emboli. Infiltrate is again seen  in the left base with associated effusions stable from the previous day. Electronically Signed   By: MInez CatalinaM.D.   On: 12/23/2018 11:57   Ct Angio Chest Pe W/cm &/or Wo Cm  Result Date: 12/22/2018 CLINICAL DATA:  28year old male with history of persistent cough for the past 3 weeks. Shortness of breath worsening over the past 2 days. EXAM: CT ANGIOGRAPHY CHEST WITH CONTRAST TECHNIQUE: Multidetector CT imaging of the chest was performed using the standard protocol during bolus administration of intravenous contrast. Multiplanar CT image reconstructions and MIPs were obtained to evaluate the vascular anatomy. CONTRAST:  1048mOMNIPAQUE IOHEXOL 350 MG/ML SOLN COMPARISON:  No priors. FINDINGS: Comment: Today's study is severely limited by a large amount of patient respiratory  motion. Cardiovascular: There are potential filling defects within segmental sized pulmonary artery branches to the lower lobes of the lungs bilaterally, but assessment is severely limited by extensive patient respiratory motion. No larger central filling defects are noted. Heart size is normal. There is no significant pericardial fluid, thickening or pericardial calcification. Mediastinum/Nodes: No pathologically enlarged mediastinal or hilar lymph nodes. Esophagus is unremarkable in appearance. No axillary lymphadenopathy. Lungs/Pleura: There is a combination of airspace consolidation and ground-glass attenuation throughout the left lower lobe. Some airspace consolidation is also noted in the posterior aspect of the left upper lobe. Volume loss in the right lower lobe. Small left pleural effusion lying dependently. No definite suspicious appearing pulmonary nodules or masses are noted. Upper Abdomen: Unremarkable. Musculoskeletal: There are no aggressive appearing lytic or blastic lesions noted in the visualized portions of the skeleton. Review of the MIP images confirms the above findings. IMPRESSION: 1. Airspace consolidation and  extensive ground-glass attenuation throughout the left lower lobe, and to a lesser extent in the posterior aspect of the left upper lobe. If there are infectious symptoms, this may simply reflect pneumonia. However, there are potential filling defects in the pulmonary arteries extending to this distribution (as well as in the right lower lobe to a lesser extent) which could reflect pulmonary embolism. Unfortunately, today's study is extremely limited by patient respiratory motion such that definitive diagnosis of pulmonary embolism is not possible. 2. Small left pleural effusion lying dependently. These results were called by telephone at the time of interpretation on 12/22/2018 at 3:41 pm to Dr. Francine Graven, who verbally acknowledged these results. Electronically Signed   By: Vinnie Langton M.D.   On: 12/22/2018 15:45   Vas [place] Lower Extremity Venous (dvt)  Result Date: 12/23/2018  Lower Venous Study Indications: Pulmonary embolism.  Limitations: Body habitus and musculoskeletal features. Comparison Study: No prior study available Performing Technologist: Rudell Cobb  Examination Guidelines: A complete evaluation includes B-mode imaging, spectral Doppler, color Doppler, and power Doppler as needed of all accessible portions of each vessel. Bilateral testing is considered an integral part of a complete examination. Limited examinations for reoccurring indications may be performed as noted.  Right Venous Findings: +---------+---------------+---------+-----------+----------+-------+          CompressibilityPhasicitySpontaneityPropertiesSummary +---------+---------------+---------+-----------+----------+-------+ CFV      Full           Yes      Yes                          +---------+---------------+---------+-----------+----------+-------+ SFJ      Full                                                 +---------+---------------+---------+-----------+----------+-------+ FV Prox  Full                                                  +---------+---------------+---------+-----------+----------+-------+ FV Mid   Full                                                 +---------+---------------+---------+-----------+----------+-------+  FV DistalFull                                                 +---------+---------------+---------+-----------+----------+-------+ PFV      Full                                                 +---------+---------------+---------+-----------+----------+-------+ POP      Full           Yes      Yes                          +---------+---------------+---------+-----------+----------+-------+ PTV      Full                                                 +---------+---------------+---------+-----------+----------+-------+ PERO     Full                                                 +---------+---------------+---------+-----------+----------+-------+  Left Venous Findings: +---------+---------------+---------+-----------+----------+-------------------+          CompressibilityPhasicitySpontaneityPropertiesSummary             +---------+---------------+---------+-----------+----------+-------------------+ CFV      Full           Yes      Yes                                      +---------+---------------+---------+-----------+----------+-------------------+ SFJ      Full                                                             +---------+---------------+---------+-----------+----------+-------------------+ FV Prox  Full                                                             +---------+---------------+---------+-----------+----------+-------------------+ FV Mid   Full                                                             +---------+---------------+---------+-----------+----------+-------------------+ FV DistalFull                                                              +---------+---------------+---------+-----------+----------+-------------------+  PFV      Full                                                             +---------+---------------+---------+-----------+----------+-------------------+ POP      Full           Yes      Yes                                      +---------+---------------+---------+-----------+----------+-------------------+ PTV                              Yes                  difficult to                                                              compress maybe due                                                        to body habitus and                                                       muscular feature.   +---------+---------------+---------+-----------+----------+-------------------+ PERO                             Yes                  not well visualized +---------+---------------+---------+-----------+----------+-------------------+    Summary: Right: There is no evidence of deep vein thrombosis in the lower extremity. However, portions of this examination were limited- see technologist comments above. No cystic structure found in the popliteal fossa. Left: There is no evidence of deep vein thrombosis in the lower extremity. However, portions of this examination were limited- see technologist comments above. No cystic structure found in the popliteal fossa.  *See table(s) above for measurements and observations.    Preliminary     Orris Perin T. [facility] Pager [phone]  If 7PM-7AM, please contact night-coverage [email or web address] Password [email or web address] 12/23/2018, 2:04 PM

## 2018-12-24 DIAGNOSIS — I2699 Other pulmonary embolism without acute cor pulmonale: Principal | ICD-10-CM

## 2018-12-24 DIAGNOSIS — Z7289 Other problems related to lifestyle: Secondary | ICD-10-CM

## 2018-12-24 LAB — CBC
HCT: 37.6 % — ABNORMAL LOW (ref 39.0–52.0)
Hemoglobin: 12.3 g/dL — ABNORMAL LOW (ref 13.0–17.0)
MCH: 30 pg (ref 26.0–34.0)
MCHC: 32.7 g/dL (ref 30.0–36.0)
MCV: 91.7 fL (ref 80.0–100.0)
NRBC: 0 % (ref 0.0–0.2)
Platelets: 234 10*3/uL (ref 150–400)
RBC: 4.1 MIL/uL — ABNORMAL LOW (ref 4.22–5.81)
RDW: 12.1 % (ref 11.5–15.5)
WBC: 8.8 10*3/uL (ref 4.0–10.5)

## 2018-12-24 LAB — BASIC METABOLIC PANEL
Anion gap: 7 (ref 5–15)
BUN: 13 mg/dL (ref 6–20)
CO2: 23 mmol/L (ref 22–32)
Calcium: 8.4 mg/dL — ABNORMAL LOW (ref 8.9–10.3)
Chloride: 108 mmol/L (ref 98–111)
Creatinine, Ser: 0.87 mg/dL (ref 0.61–1.24)
GFR calc Af Amer: >60 mL/min (ref >60)
GFR calc non Af Amer: >60 mL/min (ref >60)
Glucose, Bld: 97 mg/dL (ref 70–99)
Potassium: 3.6 mmol/L (ref 3.5–5.1)
SODIUM: 138 mmol/L (ref 135–145)

## 2018-12-24 LAB — PROCALCITONIN: Procalcitonin: <0.1 ng/mL

## 2018-12-24 LAB — ANTINUCLEAR ANTIBODIES, IFA: ANA Ab, IFA: NEGATIVE

## 2018-12-24 LAB — HOMOCYSTEINE: Homocysteine: 9.4 umol/L (ref 0.0–14.5)

## 2018-12-24 MED ORDER — APIXABAN 5 MG PO TABS: ORAL_TABLET | ORAL | 0 refills | Status: DC

## 2018-12-24 MED ORDER — APIXABAN 5 MG PO TABS
10.0000 mg | ORAL_TABLET | Freq: Two times a day (BID) | ORAL | Status: DC
  Administered 2018-12-24: 10 mg via ORAL
  Filled 2018-12-24: qty 2

## 2018-12-24 MED ORDER — LEVALBUTEROL HCL 0.63 MG/3ML IN NEBU: 0.6300 mg | INHALATION_SOLUTION | Freq: Four times a day (QID) | RESPIRATORY_TRACT | Status: DC | PRN

## 2018-12-24 MED ORDER — APIXABAN 5 MG PO TABS: 5.0000 mg | ORAL_TABLET | Freq: Two times a day (BID) | ORAL | Status: DC

## 2018-12-24 NOTE — Care Management Note (Signed)
Case Management Note  Patient Details  Name: David Reid MRN: [id number] Date of Birth: [birth year]  Subjective/Objective:  Provided patient w/pcp listing within [facility]-patient voiced understanding. [place] benefit checked for eliquis/xarelto-not covered-patient will be provided w/30day free eliquis coupon then will have pcp appt-to discuss meds further as otpt. No further CM needs.                  Action/Plan:dc home.   Expected Discharge Date:  12/24/18               Expected Discharge Plan:  Home/Self Care  In-House Referral:     Discharge planning Services  CM Consult  Post Acute Care Choice:    Choice offered to:     DME Arranged:    DME Agency:     HH Arranged:    HH Agency:     Status of Service:  Completed, signed off  If discussed at [employer] of Stay Meetings, dates discussed:    Additional Comments:  Lanier Clam, RN 12/24/2018, 11:26 AM

## 2018-12-24 NOTE — Progress Notes (Signed)
MEWS Guidelines - (patients age 30 and over)  VS @ 0003 indicated a change to YELLOW status. Pt assessed, Tylenol 650mg  po administered at 0009 for temp, encouraged TCDB exercises, Charge RN notified, RRT RN to be notified if pt worsens, MEWS Yellow protocol initiated. Will cont to monitor pt status.   Red - At High Risk for Deterioration Yellow - At risk for Deterioration  1. Go to room and assess patient 2. Validate data. Is this patient's baseline? If data confirmed: 3. Is this an acute change? 4. Administer prn meds/treatments as ordered. 5. Note Sepsis score 6. Review goals of care 7. Radiation protection practitioner, RRT nurse and Provider. 8. Ask Provider to come to bedside.  9. Document patient condition/interventions/response. 10. Increase frequency of vital signs and focused assessments to at least q15 minutes x 4, then q30 minutes x2. - If stable, then q1h x3, then q4h x3 and then q8h or dept. routine. - If unstable, contact Provider & RRT nurse. Prepare for possible transfer. 11. Add entry in progress notes using the smart phrase ".MEWS". 1. Go to room and assess patient 2. Validate data. Is this patient's baseline? If data confirmed: 3. Is this an acute change? 4. Administer prn meds/treatments as ordered? 5. Note Sepsis score 6. Review goals of care 7. Radiation protection practitioner and Provider 8. Call RRT nurse as needed. 9. Document patient condition/interventions/response. 10. Increase frequency of vital signs and focused assessments to at least q2h x2. - If stable, then q4h x2 and then q8h or dept. routine. - If unstable, contact Provider & RRT nurse. Prepare for possible transfer. 11. Add entry in progress notes using the smart phrase ".MEWS".  Green - Likely stable Lavender - Comfort Care Only  1. Continue routine/ordered monitoring.  2. Review goals of care. 1. Continue routine/ordered monitoring. 2. Review goals of care.

## 2018-12-24 NOTE — Discharge Instructions (Addendum)
Information on my medicine - ELIQUIS (apixaban)  This medication education was reviewed with me or my healthcare representative as part of my discharge preparation.  The pharmacist that spoke with me during my hospital stay was:  Shaymus Eveleth A, RPH  Why was Eliquis prescribed for you? Eliquis was prescribed to treat blood clots that may have been found in the veins of your legs (deep vein thrombosis) or in your lungs (pulmonary embolism) and to reduce the risk of them occurring again.  What do You need to know about Eliquis ? The starting dose is 10 mg (two 5 mg tablets) taken TWICE daily for the FIRST SEVEN (7) DAYS, then on (enter date)  12/31/18  the dose is reduced to ONE 5 mg tablet taken TWICE daily.  Eliquis may be taken with or without food.   Try to take the dose about the same time in the morning and in the evening. If you have difficulty swallowing the tablet whole please discuss with your pharmacist how to take the medication safely.  Take Eliquis exactly as prescribed and DO NOT stop taking Eliquis without talking to the doctor who prescribed the medication.  Stopping may increase your risk of developing a new blood clot.  Refill your prescription before you run out.  After discharge, you should have regular check-up appointments with your healthcare provider that is prescribing your Eliquis.    What do you do if you miss a dose? If a dose of ELIQUIS is not taken at the scheduled time, take it as soon as possible on the same day and twice-daily administration should be resumed. The dose should not be doubled to make up for a missed dose.  Important Safety Information A possible side effect of Eliquis is bleeding. You should call your healthcare provider right away if you experience any of the following: ? Bleeding from an injury or your nose that does not stop. ? Unusual colored urine (red or dark brown) or unusual colored stools (red or black). ? Unusual bruising for  unknown reasons. ? A serious fall or if you hit your head (even if there is no bleeding).  Some medicines may interact with Eliquis and might increase your risk of bleeding or clotting while on Eliquis. To help avoid this, consult your healthcare provider or pharmacist prior to using any new prescription or non-prescription medications, including herbals, vitamins, non-steroidal anti-inflammatory drugs (NSAIDs) and supplements.  This website has more information on Eliquis (apixaban): http://www.eliquis.com/eliquis/home

## 2018-12-24 NOTE — Progress Notes (Signed)
ANTICOAGULATION CONSULT NOTE -  Consult  Pharmacy Consult for Heparin transition to Eliquis  Indication: pulmonary embolus  No Known Allergies  Patient Measurements: Height: 5\' 10"  (177.8 cm) Weight: 230 lb 1.6 oz (104.4 kg) IBW/kg (Calculated) : 73 HEPARIN DW (KG): 93.8  Vital Signs: Temp: 99.7 F (37.6 C) (03/04 0810) Temp Source: Oral (03/04 0810) BP: 141/83 (03/04 0810) Pulse Rate: 109 (03/04 1038)  Labs: Recent Labs    12/22/18 1206 12/22/18 1608 12/22/18 2319 12/23/18 0632 12/24/18 0546  HGB 14.9  --   --  13.5 12.3*  HCT 44.5  --   --  41.5 37.6*  PLT 220  --   --  228 234  APTT  --  28  --   --   --   LABPROT  --  13.6  --   --   --   INR  --  1.1  --   --   --   HEPARINUNFRC  --   --  0.21* 0.39  --   CREATININE 1.01  --   --   --  0.87  TROPONINI <0.03  --   --   --   --     Estimated Creatinine Clearance: 151.7 mL/min (by C-G formula based on SCr of 0.87 mg/dL).   Assessment: 28 yo M with recent travel presents with persistent cough & fevers.  Chest CT confirmatory for PE.  Pharmacy consulted to transition from Heparin to Eliquis for treatment of PE.  Baseline CBC WNL.   12/24/2018 Doctor wants to transition from Heparin to Eliquis today.  CBC WNL.  No bleeding reported. No drug interactions noted.   Goal of Therapy:  Prevent thrombus extension, prevent recurrence, reduce mortality, and minimize adverse effects.    Plan:   Initiate Eliquis 10 mg PO BID x 7 days, then 5 mg PO BID.   Stop heparin with the first dose of Eliquis.  Education provided to patient by pharmacist.  Carolin Sicks, PharmD Candidate [facility], Class of 2020 12/24/2018 11:33 AM

## 2018-12-24 NOTE — Discharge Summary (Signed)
Physician Discharge Summary  YOLTZIN LAHNER FXO:[id number] DOB: 07-04-1991 DOA: 12/22/2018  PCP: Patient, No Pcp Per  Admit date: 12/22/2018 Discharge date: 12/24/2018  Admitted From: Home Disposition: Home  Recommendations for Outpatient Follow-up:  1. Follow up with PCP in 1-2 weeks 2. Please obtain BMP/CBC in one week your next doctors visit.  3. Eliquis 10 mg twice daily for 7 days followed by 5 mg twice daily.  Home Health: None Equipment/Devices: None Discharge Condition: Stable CODE STATUS: Full code Diet recommendation: Regular  Brief/Interim Summary: 28 year old without any previous known past medical history came to the hospital complains of cough, shortness of breath and fever.  He also reported of left-sided pleuritic chest pain.  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  Also has family history positive for multiple blood clots and strokes.  He also had a recent travel from [place] to [place].  CT of the chest was consistent with bilateral pulmonary embolism with associated stable effusion.  He was started on heparin drip.  Echocardiogram showed ejection fraction 60 to 65% with impaired relaxation but no strain was noted. Lower extremity Dopplers were negative for DVT.  After prolonged discussion with the patient and the mother it was determined to transition him over to Eliquis which she will be on it for at least 6 months to 1 year.  Hypercoagulable work-up has been sent given strong family history which can be followed up outpatient to determine if he needs indefinite treatment for anticoagulation.  Today patient is ambulating in the hallway without complaining of chest pain, shortness of breath or hypoxia.   Discharge Diagnoses:  Principal Problem:   CAP (community acquired pneumonia) Active Problems:   Sepsis (HCC)   Tachycardia   suspected Pulmonary embolism (HCC)   Pain in scapula   history of Drinking binge  Acute bilateral pulmonary embolism without cor  pulmonale -Multifactorial due to recent travel and also likely genetic in nature given strong family history.  Lower extremity Dopplers is negative.  Echocardiogram appears to be stable.  He is currently on room air and ambulating without requiring any oxygen.  Hypercoagulable work-up has been sent which can be followed up outpatient. -We will transition him from heparin drip to Eliquis.  I have had prolonged discussion with the patient and his mother about patient's condition and anticoagulation option. -Fever is secondary to underlying blood clot.  Procalcitonin levels negative, will discontinue antibiotics.  Marijuana use -Counseled to quit using this  Alcohol use -Counseled to quit using alcohol.  No obvious signs of withdrawal noted at this time.  Patient on heparin drip during hospitalization Full code Mother at bedside.  Stable for discharge today.  Consultations:  None  Subjective: Feels better.  Ambulating in the hallway without any issue.  Very mild tachycardia but no shortness of breath or hypoxia.  Denies dizziness or chest pain.  Wishes to go home. Had an extensive discussion with the patient and the mother regarding his condition, genetic work-up and different types of anticoagulation.  They prefer patient to be on Eliquis.  Discharge Exam: Vitals:   12/24/18 1035 12/24/18 1038  BP:    Pulse: (!) 127 (!) 109  Resp:    Temp:    SpO2: 96%    Vitals:   12/24/18 0810 12/24/18 1030 12/24/18 1035 12/24/18 1038  BP: (!) 141/83     Pulse: (!) 108 (!) 112 (!) 127 (!) 109  Resp: 15     Temp: 99.7 F (37.6 C)  TempSrc: Oral     SpO2: 93% 99% 96%   Weight:      Height:        General: Pt is alert, awake, not in acute distress Cardiovascular: RRR, S1/S2 +, no rubs, no gallops Respiratory: CTA bilaterally, no wheezing, no rhonchi Abdominal: Soft, NT, ND, bowel sounds + Extremities: no edema, no cyanosis  Discharge Instructions   Allergies as of 12/24/2018   No  Known Allergies     Medication List    TAKE these medications   apixaban 5 MG Tabs tablet Commonly known as:  ELIQUIS Take 2 tablets (10 mg total) by mouth 2 (two) times daily for 7 days, THEN 1 tablet (5 mg total) 2 (two) times daily for 24 days. Start taking on:  December 24, 2018   ibuprofen 200 MG tablet Commonly known as:  ADVIL,MOTRIN Take 400 mg by mouth daily as needed for fever.   multivitamin with minerals Tabs tablet Take 1 tablet by mouth daily.   TYLENOL 500 MG tablet Generic drug:  acetaminophen Take 1,000 mg by mouth daily as needed for fever.      Follow-up Information    primary care physician. Schedule an appointment as soon as possible for a visit.          No Known Allergies  You were cared for by a hospitalist during your hospital stay. If you have any questions about your discharge medications or the care you received while you were in the hospital after you are discharged, you can call the unit and asked to speak with the hospitalist on call if the hospitalist that took care of you is not available. Once you are discharged, your primary care physician will handle any further medical issues. Please note that no refills for any discharge medications will be authorized once you are discharged, as it is imperative that you return to your primary care physician (or establish a relationship with a primary care physician if you do not have one) for your aftercare needs so that they can reassess your need for medications and monitor your lab values.   Procedures/Studies: Ct Angio Chest Pe W Or Wo Contrast  Result Date: 12/23/2018 CLINICAL DATA:  Shortness of breath and limited previous CTA EXAM: CT ANGIOGRAPHY CHEST WITH CONTRAST TECHNIQUE: Multidetector CT imaging of the chest was performed using the standard protocol during bolus administration of intravenous contrast. Multiplanar CT image reconstructions and MIPs were obtained to evaluate the vascular anatomy.  CONTRAST:  OMNIPAQUE IOHEXOL 350 MG/ML SOLN COMPARISON:  Exam from the previous day. FINDINGS: Cardiovascular: Thoracic aorta is again within normal limits. Pulmonary artery is well visualized although the enhancement pattern is limited. Filling defects are noted in the pulmonary artery bilaterally consistent with pulmonary emboli. These are better visualized than on the previous day. No pulmonary arterial enlargement is seen. The heart is mildly prominent. No coronary calcifications are seen. Mediastinum/Nodes: Thoracic inlet is within normal limits. Lungs/Pleura: Right lung is well aerated with the exception of minimal lower lobe atelectasis. More marked infiltrate is noted in the left lower lobe similar to that seen on the prior exam with associated small effusion. No definitive nodular changes are seen. Upper Abdomen: Visualized upper abdomen is within normal limits. Musculoskeletal: Bony structures are within normal limits stable from the previous study. Review of the MIP images confirms the above findings. IMPRESSION: Changes consistent with bilateral pulmonary emboli. Infiltrate is again seen in the left base with associated effusions stable from the previous day.  Electronically Signed   By: Alcide Clever M.D.   On: 12/23/2018 11:57   Ct Angio Chest Pe W/cm &/or Wo Cm  Result Date: 12/22/2018 CLINICAL DATA:  27 year old male with history of persistent cough for the past 3 weeks. Shortness of breath worsening over the past 2 days. EXAM: CT ANGIOGRAPHY CHEST WITH CONTRAST TECHNIQUE: Multidetector CT imaging of the chest was performed using the standard protocol during bolus administration of intravenous contrast. Multiplanar CT image reconstructions and MIPs were obtained to evaluate the vascular anatomy. CONTRAST:  OMNIPAQUE IOHEXOL 350 MG/ML SOLN COMPARISON:  No priors. FINDINGS: Comment: Today's study is severely limited by a large amount of patient respiratory motion. Cardiovascular: There are  potential filling defects within segmental sized pulmonary artery branches to the lower lobes of the lungs bilaterally, but assessment is severely limited by extensive patient respiratory motion. No larger central filling defects are noted. Heart size is normal. There is no significant pericardial fluid, thickening or pericardial calcification. Mediastinum/Nodes: No pathologically enlarged mediastinal or hilar lymph nodes. Esophagus is unremarkable in appearance. No axillary lymphadenopathy. Lungs/Pleura: There is a combination of airspace consolidation and ground-glass attenuation throughout the left lower lobe. Some airspace consolidation is also noted in the posterior aspect of the left upper lobe. Volume loss in the right lower lobe. Small left pleural effusion lying dependently. No definite suspicious appearing pulmonary nodules or masses are noted. Upper Abdomen: Unremarkable. Musculoskeletal: There are no aggressive appearing lytic or blastic lesions noted in the visualized portions of the skeleton. Review of the MIP images confirms the above findings. IMPRESSION: 1. Airspace consolidation and extensive ground-glass attenuation throughout the left lower lobe, and to a lesser extent in the posterior aspect of the left upper lobe. If there are infectious symptoms, this may simply reflect pneumonia. However, there are potential filling defects in the pulmonary arteries extending to this distribution (as well as in the right lower lobe to a lesser extent) which could reflect pulmonary embolism. Unfortunately, today's study is extremely limited by patient respiratory motion such that definitive diagnosis of pulmonary embolism is not possible. 2. Small left pleural effusion lying dependently. These results were called by telephone at the time of interpretation on 12/22/2018 at 3:41 pm to Dr. Samuel Jester, who verbally acknowledged these results. Electronically Signed   By: Trudie Reed M.D.   On: 12/22/2018  15:45   Vas [place] Lower Extremity Venous (dvt)  Result Date: 12/23/2018  Lower Venous Study Indications: Pulmonary embolism.  Limitations: Body habitus and musculoskeletal features. Comparison Study: No prior study available Performing Technologist: Melodie Bouillon  Examination Guidelines: A complete evaluation includes B-mode imaging, spectral Doppler, color Doppler, and power Doppler as needed of all accessible portions of each vessel. Bilateral testing is considered an integral part of a complete examination. Limited examinations for reoccurring indications may be performed as noted.  Right Venous Findings: +---------+---------------+---------+-----------+----------+-------+          CompressibilityPhasicitySpontaneityPropertiesSummary +---------+---------------+---------+-----------+----------+-------+ CFV      Full           Yes      Yes                          +---------+---------------+---------+-----------+----------+-------+ SFJ      Full                                                 +---------+---------------+---------+-----------+----------+-------+  FV Prox  Full                                                 +---------+---------------+---------+-----------+----------+-------+ FV Mid   Full                                                 +---------+---------------+---------+-----------+----------+-------+ FV DistalFull                                                 +---------+---------------+---------+-----------+----------+-------+ PFV      Full                                                 +---------+---------------+---------+-----------+----------+-------+ POP      Full           Yes      Yes                          +---------+---------------+---------+-----------+----------+-------+ PTV      Full                                                 +---------+---------------+---------+-----------+----------+-------+ PERO     Full                                                  +---------+---------------+---------+-----------+----------+-------+  Left Venous Findings: +---------+---------------+---------+-----------+----------+-------------------+          CompressibilityPhasicitySpontaneityPropertiesSummary             +---------+---------------+---------+-----------+----------+-------------------+ CFV      Full           Yes      Yes                                      +---------+---------------+---------+-----------+----------+-------------------+ SFJ      Full                                                             +---------+---------------+---------+-----------+----------+-------------------+ FV Prox  Full                                                             +---------+---------------+---------+-----------+----------+-------------------+ FV  Mid   Full                                                             +---------+---------------+---------+-----------+----------+-------------------+ FV DistalFull                                                             +---------+---------------+---------+-----------+----------+-------------------+ PFV      Full                                                             +---------+---------------+---------+-----------+----------+-------------------+ POP      Full           Yes      Yes                                      +---------+---------------+---------+-----------+----------+-------------------+ PTV                              Yes                  difficult to                                                              compress maybe due                                                        to body habitus and                                                       muscular feature.   +---------+---------------+---------+-----------+----------+-------------------+ PERO                              Yes                  not well visualized +---------+---------------+---------+-----------+----------+-------------------+    Summary: Right: There is no evidence of deep vein thrombosis in the lower extremity. However, portions of this examination were limited- see technologist comments above. No cystic structure found in the popliteal fossa. Left: There is no evidence of deep vein thrombosis in the lower extremity. However, portions of this examination were limited- see technologist  comments above. No cystic structure found in the popliteal fossa.  *See table(s) above for measurements and observations. Electronically signed by Waverly Ferrari MD on 12/23/2018 at 3:56:40 PM.    Final       The results of significant diagnostics from this hospitalization (including imaging, microbiology, ancillary and laboratory) are listed below for reference.     Microbiology: Recent Results (from the past 240 hour(s))  Culture, blood (routine x 2) Call MD if unable to obtain prior to antibiotics being given     Status: None (Preliminary result)   Collection Time: 12/22/18  4:29 PM  Result Value Ref Range Status   Specimen Description   Final    BLOOD RIGHT FOREARM Performed at [facility], [address], [place], [place] [address]    Special Requests   Final    BOTTLES DRAWN AEROBIC AND ANAEROBIC Blood Culture adequate volume Performed at [facility], [address], [place], [place] [address]    Culture   Final    NO GROWTH 2 DAYS Performed at [facility], [address] [place], [place] [address]    Report Status PENDING  Incomplete  Culture, blood (routine x 2) Call MD if unable to obtain prior to antibiotics being given     Status: None (Preliminary result)   Collection Time: 12/22/18  4:29 PM  Result Value Ref Range Status   Specimen Description   Final    BLOOD RIGHT ANTECUBITAL Performed at [facility], [address] [place], [place] [address]    Special Requests   Final    BOTTLES DRAWN AEROBIC AND ANAEROBIC Blood Culture adequate volume Performed at [facility], [address], [place], [place] [address]    Culture   Final    NO GROWTH 2 DAYS Performed at [facility], [address], [place], [place] [address]    Report Status PENDING  Incomplete  Respiratory Panel by PCR     Status: None   Collection Time: 12/22/18  4:32 PM  Result Value Ref Range Status   Adenovirus NOT DETECTED NOT DETECTED Final   Coronavirus 229E NOT DETECTED NOT DETECTED Final    Comment: (NOTE) The Coronavirus on the Respiratory Panel, DOES NOT test for the novel  Coronavirus (2019 nCoV)    Coronavirus HKU1 NOT DETECTED NOT DETECTED Final   Coronavirus NL63 NOT DETECTED NOT DETECTED Final   Coronavirus OC43 NOT DETECTED NOT DETECTED Final   Metapneumovirus NOT DETECTED NOT DETECTED Final   Rhinovirus / Enterovirus NOT DETECTED NOT DETECTED Final   Influenza A NOT DETECTED NOT DETECTED Final   Influenza B NOT DETECTED NOT DETECTED Final   Parainfluenza Virus 1 NOT DETECTED NOT DETECTED Final   Parainfluenza Virus 2 NOT DETECTED NOT DETECTED Final   Parainfluenza Virus 3 NOT DETECTED NOT DETECTED Final   Parainfluenza Virus 4 NOT DETECTED NOT DETECTED Final   Respiratory Syncytial Virus NOT DETECTED NOT DETECTED Final   Bordetella pertussis NOT DETECTED NOT DETECTED Final   Chlamydophila pneumoniae NOT DETECTED NOT DETECTED Final   Mycoplasma pneumoniae NOT DETECTED NOT DETECTED Final    Comment: Performed at [facility], [address], [place], [place] [address]  Culture, sputum-assessment     Status: None   Collection Time: 12/22/18  5:23 PM  Result Value Ref Range Status   Specimen Description EXPECTORATED SPUTUM  Final   Special Requests NONE  Final   Sputum evaluation  Final    Sputum specimen not acceptable for testing.  Please recollect.   CALLED ABBY RN [phone]  12/22/18 A NAVARRO Performed at [facility], [address], [place], [place] [address]    Report Status 12/22/2018 FINAL  Final  MRSA PCR Screening     Status: None   Collection Time: 12/22/18  5:43 PM  Result Value Ref Range Status   MRSA by PCR NEGATIVE NEGATIVE Final    Comment:        The GeneXpert MRSA Assay (FDA approved for NASAL specimens only), is one component of a comprehensive MRSA colonization surveillance program. It is not intended to diagnose MRSA infection nor to guide or monitor treatment for MRSA infections. Performed at [facility], [address] [place], [place] [address]      Labs: BNP (last 3 results) Recent Labs    12/22/18 2012  BNP 12.9   Basic Metabolic Panel: Recent Labs  Lab 12/22/18 1206 12/24/18 0546  NA 137 138  K 3.8 3.6  CL 100 108  CO2 24 23  GLUCOSE 94 97  BUN 22* 13  CREATININE 1.01 0.87  CALCIUM 9.4 8.4*   Liver Function Tests: No results for input(s): AST, ALT, ALKPHOS, BILITOT, PROT, ALBUMIN in the last 168 hours. No results for input(s): LIPASE, AMYLASE in the last 168 hours. No results for input(s): AMMONIA in the last 168 hours. CBC: Recent Labs  Lab 12/22/18 1206 12/23/18 0632 12/24/18 0546  WBC 14.2* 10.5 8.8  NEUTROABS 10.7*  --   --   HGB 14.9 13.5 12.3*  HCT 44.5 41.5 37.6*  MCV 88.8 92.0 91.7  PLT 220 228 234   Cardiac Enzymes: Recent Labs  Lab 12/22/18 1206  TROPONINI <0.03   BNP: Invalid input(s): POCBNP CBG: No results for input(s): GLUCAP in the last 168 hours. D-Dimer No results for input(s): DDIMER in the last 72 hours. Hgb A1c No results for input(s): HGBA1C in the last 72 hours. Lipid Profile No results for input(s): CHOL, HDL, LDLCALC, TRIG, CHOLHDL, LDLDIRECT in the last 72 hours. Thyroid function studies Recent Labs    12/22/18 2012  TSH 3.551   Anemia work up No results for input(s): VITAMINB12, FOLATE, FERRITIN, TIBC, IRON,  RETICCTPCT in the last 72 hours. Urinalysis No results found for: COLORURINE, APPEARANCEUR, LABSPEC, PHURINE, GLUCOSEU, HGBUR, BILIRUBINUR, KETONESUR, PROTEINUR, UROBILINOGEN, NITRITE, LEUKOCYTESUR Sepsis Labs Invalid input(s): PROCALCITONIN,  WBC,  LACTICIDVEN Microbiology Recent Results (from the past 240 hour(s))  Culture, blood (routine x 2) Call MD if unable to obtain prior to antibiotics being given     Status: None (Preliminary result)   Collection Time: 12/22/18  4:29 PM  Result Value Ref Range Status   Specimen Description   Final    BLOOD RIGHT FOREARM Performed at [facility], [address], [place], [place] [address]    Special Requests   Final    BOTTLES DRAWN AEROBIC AND ANAEROBIC Blood Culture adequate volume Performed at [facility], [address], [place], [place] [address]    Culture   Final    NO GROWTH 2 DAYS Performed at [facility], [address] [place], [place] [address]    Report Status PENDING  Incomplete  Culture, blood (routine x 2) Call MD if unable to obtain prior to antibiotics being given     Status: None (Preliminary result)   Collection Time: 12/22/18  4:29 PM  Result Value Ref Range Status  Specimen Description   Final    BLOOD RIGHT ANTECUBITAL Performed at [facility], [address] [place], [place] [address]    Special Requests   Final    BOTTLES DRAWN AEROBIC AND ANAEROBIC Blood Culture adequate volume Performed at [facility], [address], [place], [place] [address]    Culture   Final    NO GROWTH 2 DAYS Performed at [facility], [address], [place], [place] [address]    Report Status PENDING  Incomplete  Respiratory Panel by PCR     Status: None   Collection Time: 12/22/18  4:32 PM  Result Value Ref Range Status   Adenovirus NOT DETECTED NOT DETECTED Final   Coronavirus 229E NOT DETECTED NOT DETECTED Final    Comment:  (NOTE) The Coronavirus on the Respiratory Panel, DOES NOT test for the novel  Coronavirus (2019 nCoV)    Coronavirus HKU1 NOT DETECTED NOT DETECTED Final   Coronavirus NL63 NOT DETECTED NOT DETECTED Final   Coronavirus OC43 NOT DETECTED NOT DETECTED Final   Metapneumovirus NOT DETECTED NOT DETECTED Final   Rhinovirus / Enterovirus NOT DETECTED NOT DETECTED Final   Influenza A NOT DETECTED NOT DETECTED Final   Influenza B NOT DETECTED NOT DETECTED Final   Parainfluenza Virus 1 NOT DETECTED NOT DETECTED Final   Parainfluenza Virus 2 NOT DETECTED NOT DETECTED Final   Parainfluenza Virus 3 NOT DETECTED NOT DETECTED Final   Parainfluenza Virus 4 NOT DETECTED NOT DETECTED Final   Respiratory Syncytial Virus NOT DETECTED NOT DETECTED Final   Bordetella pertussis NOT DETECTED NOT DETECTED Final   Chlamydophila pneumoniae NOT DETECTED NOT DETECTED Final   Mycoplasma pneumoniae NOT DETECTED NOT DETECTED Final    Comment: Performed at [facility], [address], [place], [place] [address]  Culture, sputum-assessment     Status: None   Collection Time: 12/22/18  5:23 PM  Result Value Ref Range Status   Specimen Description EXPECTORATED SPUTUM  Final   Special Requests NONE  Final   Sputum evaluation   Final    Sputum specimen not acceptable for testing.  Please recollect.   CALLED ABBY RN 2114 12/22/18 A NAVARRO Performed at [facility], [address] [place], [place] [address]    Report Status 12/22/2018 FINAL  Final  MRSA PCR Screening     Status: None   Collection Time: 12/22/18  5:43 PM  Result Value Ref Range Status   MRSA by PCR NEGATIVE NEGATIVE Final    Comment:        The GeneXpert MRSA Assay (FDA approved for NASAL specimens only), is one component of a comprehensive MRSA colonization surveillance program. It is not intended to diagnose MRSA infection nor to guide or monitor treatment for MRSA infections. Performed at [facility], [address] [place], [place] [address]      Time coordinating discharge:  I have spent 35 minutes face to face with the patient and on the ward discussing the patients care, assessment, plan and disposition with other care givers. >50% of the time was devoted counseling the patient about the risks and benefits of treatment/Discharge disposition and coordinating care.   SIGNED:   Dimple Nanas, MD  [facility] 12/24/2018, 11:57 AM   If 7PM-7AM, please contact night-coverage [email or web address]

## 2018-12-25 LAB — CARDIOLIPIN ANTIBODIES, IGG, IGM, IGA
Anticardiolipin IgA: <9 APL U/mL (ref 0–11)
Anticardiolipin IgG: <9 GPL U/mL (ref 0–14)
Anticardiolipin IgM: <9 MPL U/mL (ref 0–12)

## 2018-12-25 LAB — URINE CYTOLOGY ANCILLARY ONLY
Chlamydia: NEGATIVE
Neisseria Gonorrhea: NEGATIVE

## 2018-12-25 LAB — BETA-2-GLYCOPROTEIN I ABS, IGG/M/A
Beta-2 Glyco I IgG: <9 GPI IgG units (ref 0–20)
Beta-2-Glycoprotein I IgA: <9 GPI IgA units (ref 0–25)
Beta-2-Glycoprotein I IgM: <9 GPI IgM units (ref 0–32)

## 2018-12-26 LAB — PROTEIN S ACTIVITY: Protein S Activity: 58 % — ABNORMAL LOW (ref 63–140)

## 2018-12-26 LAB — PROTEIN S, TOTAL: Protein S Ag, Total: 99 % (ref 60–150)

## 2018-12-26 LAB — PROTEIN C ACTIVITY: Protein C Activity: 93 % (ref 73–180)

## 2018-12-26 LAB — PROTEIN C, TOTAL: Protein C, Total: 82 % (ref 60–150)

## 2018-12-27 LAB — CULTURE, BLOOD (ROUTINE X 2)
Culture: NO GROWTH
Culture: NO GROWTH
Special Requests: ADEQUATE
Special Requests: ADEQUATE

## 2018-12-27 LAB — LUPUS ANTICOAGULANT PANEL
DRVVT: 53.7 s — ABNORMAL HIGH (ref 0.0–47.0)
PTT Lupus Anticoagulant: 104.3 s — ABNORMAL HIGH (ref 0.0–51.9)

## 2018-12-27 LAB — DRVVT MIX: dRVVT Mix: 44.5 s (ref 0.0–47.0)

## 2018-12-27 LAB — HEXAGONAL PHASE PHOSPHOLIPID: Hexagonal Phase Phospholipid: 0 s (ref 0–11)

## 2018-12-27 LAB — PTT-LA MIX: PTT-LA Mix: 97.2 s — ABNORMAL HIGH (ref 0.0–48.9)

## 2018-12-29 LAB — FACTOR 5 LEIDEN

## 2018-12-29 LAB — PROTHROMBIN GENE MUTATION

## 2019-01-12 ENCOUNTER — Encounter: Payer: Self-pay | Admitting: Family Medicine

## 2019-01-12 ENCOUNTER — Encounter

## 2019-01-12 ENCOUNTER — Other Ambulatory Visit: Payer: Self-pay

## 2019-01-12 ENCOUNTER — Ambulatory Visit (INDEPENDENT_AMBULATORY_CARE_PROVIDER_SITE_OTHER): Payer: 59 | Admitting: Family Medicine

## 2019-01-12 VITALS — BP 146/86 | HR 93 | Temp 98.3°F | Ht 70.0 in | Wt 224.6 lb

## 2019-01-12 DIAGNOSIS — D6859 Other primary thrombophilia: Secondary | ICD-10-CM

## 2019-01-12 DIAGNOSIS — I2699 Other pulmonary embolism without acute cor pulmonale: Secondary | ICD-10-CM | POA: Diagnosis not present

## 2019-01-12 DIAGNOSIS — R030 Elevated blood-pressure reading, without diagnosis of hypertension: Secondary | ICD-10-CM | POA: Diagnosis not present

## 2019-01-12 DIAGNOSIS — I10 Essential (primary) hypertension: Secondary | ICD-10-CM | POA: Insufficient documentation

## 2019-01-12 LAB — COMPREHENSIVE METABOLIC PANEL
ALT: 45 U/L (ref 0–53)
AST: 20 U/L (ref 0–37)
Albumin: 4.5 g/dL (ref 3.5–5.2)
Alkaline Phosphatase: 57 U/L (ref 39–117)
BUN: 16 mg/dL (ref 6–23)
CO2: 28 mEq/L (ref 19–32)
CREATININE: 1.02 mg/dL (ref 0.40–1.50)
Calcium: 9.6 mg/dL (ref 8.4–10.5)
Chloride: 103 mEq/L (ref 96–112)
GFR: 86.04 mL/min (ref >60.00)
Glucose, Bld: 99 mg/dL (ref 70–99)
Potassium: 4.3 mEq/L (ref 3.5–5.1)
Sodium: 139 mEq/L (ref 135–145)
Total Bilirubin: 0.4 mg/dL (ref 0.2–1.2)
Total Protein: 7.5 g/dL (ref 6.0–8.3)

## 2019-01-12 LAB — CBC
HCT: 43.1 % (ref 39.0–52.0)
Hemoglobin: 15.1 g/dL (ref 13.0–17.0)
MCHC: 35 g/dL (ref 30.0–36.0)
MCV: 86.4 fl (ref 78.0–100.0)
Platelets: 268 10*3/uL (ref 150.0–400.0)
RBC: 4.99 Mil/uL (ref 4.22–5.81)
RDW: 13.1 % (ref 11.5–15.5)
WBC: 5 10*3/uL (ref 4.0–10.5)

## 2019-01-12 MED ORDER — APIXABAN 5 MG PO TABS: 5.0000 mg | ORAL_TABLET | Freq: Two times a day (BID) | ORAL | 1 refills | Status: DC

## 2019-01-12 NOTE — Patient Instructions (Addendum)
It was very nice to see you today!  I will send in apixaban.  We will check blood work today.  I will place a referral for you to see a hematologist to discuss the long-term plan for your blood thinner medication.   Please keep up the good work with your diet and exercise.  Let me know if your blood pressures persistently 140/90 or higher.  Come back to see me in 1 year for your annual physical, or sooner as needed.  Take care, Dr [name]

## 2019-01-12 NOTE — Assessment & Plan Note (Signed)
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  Will continue anticoagulation.  Place referral to hematology to discuss lifelong versus short-term anticoagulation.

## 2019-01-12 NOTE — Assessment & Plan Note (Signed)
Slightly above goal today.  Seems to be improving based on his report.  No acute complaints starting pharmacotherapy today.  Discussed home blood pressure monitoring with goal 140/90 or lower.  Discussed importance of regular exercise and healthy, low-salt diet.

## 2019-01-12 NOTE — Assessment & Plan Note (Signed)
Improving.  Continue Eliquis 5 mg twice daily for 6 months.  Will place referral to hematology to discuss length of anticoagulation.  Discussed reasons to return to care.  Check CBC and C met today.

## 2019-01-12 NOTE — Progress Notes (Signed)
Chief Complaint:  David Reid is a 28 y.o. male who presents today with a chief complaint of pulmonary embolism and to establish care  Assessment/Plan:  Hypercoagulable state ([facility]) Positive prothrombin, factor V Leiden, and lupus anticoagulant on screening while hospitalized.  Will continue anticoagulation.  Place referral to hematology to discuss lifelong versus short-term anticoagulation.  Elevated blood pressure reading Slightly above goal today.  Seems to be improving based on his report.  No acute complaints starting pharmacotherapy today.  Discussed home blood pressure monitoring with goal 140/90 or lower.  Discussed importance of regular exercise and healthy, low-salt diet.  Pulmonary embolism (HCC) Improving.  Continue Eliquis 5 mg twice daily for 6 months.  Will place referral to hematology to discuss length of anticoagulation.  Discussed reasons to return to care.  Check CBC and C met today.     Subjective:  HPI:  Pulmonary Embolism  Patient presented to the ED on 12/22/2018 with cough, shortness of breath, and pleuritic chest pain.  Had a CTA performed which was consistent with bilateral PE.  He was started on anticoagulation and transitioned to oral Eliquis at the time of discharge.  He has done well since being discharged however has noticed increased heart rate and blood pressure readings.  He still has some occasional left side chest pain with deep breaths.  He has no prior personal history of VTE however does have a strong family history.  Had hypercoagulable work-up done during his hospitalization.  He has been compliant with Eliquis and is currently taking 5 mg twice daily.  Requests refill today.  No obvious side effects.  # Elevated Blood Pressure Readings Patient with several year history of elevated blood pressure readings.  Highest blood pressure readings into the 180s over 100s.  He has recently been trying to make several lifestyle modifications including being  more active and cutting down on his salt intake.  Does not want to start any blood pressure medications.  ROS: Per HPI, otherwise a complete review of systems was negative.   PMH:  The following were reviewed and entered/updated in epic: History reviewed. No pertinent past medical history. Patient Active Problem List   Diagnosis Date Noted  . Elevated blood pressure reading 01/12/2019  . Hypercoagulable state ([place]) 01/12/2019  . Tachycardia 12/22/2018  . Pulmonary embolism ([place]) 12/22/2018   Past Surgical History:  Procedure Laterality Date  . WISDOM TOOTH EXTRACTION      Family History  Problem Relation Age of Onset  . Hypertension Father   . Diabetes Mother     Medications- reviewed and updated Current Outpatient Medications  Medication Sig Dispense Refill  . Multiple Vitamin (MULTIVITAMIN WITH MINERALS) TABS tablet Take 1 tablet by mouth daily.    Marland Kitchen apixaban (ELIQUIS) 5 MG TABS tablet Take 1 tablet (5 mg total) by mouth 2 (two) times daily. 180 tablet 1   No current facility-administered medications for this visit.     Allergies-reviewed and updated No Known Allergies  Social History   Socioeconomic History  . Marital status: Significant Other    Spouse name: Not on file  . Number of children: Not on file  . Years of education: Not on file  . Highest education level: Not on file  Occupational History  . Not on file  Social Needs  . Financial resource strain: Not on file  . Food insecurity:    Worry: Not on file    Inability: Not on file  . Transportation needs:  Medical: Not on file    Non-medical: Not on file  Tobacco Use  . Smoking status: Never Smoker  . Smokeless tobacco: Never Used  Substance and Sexual Activity  . Alcohol use: Yes  . Drug use: Never  . Sexual activity: Yes    Partners: Female  Lifestyle  . Physical activity:    Days per week: Not on file    Minutes per session: Not on file  . Stress: Not on file  Relationships  . Social  connections:    Talks on phone: Not on file    Gets together: Not on file    Attends religious service: Not on file    Active member of club or organization: Not on file    Attends meetings of clubs or organizations: Not on file    Relationship status: Not on file  Other Topics Concern  . Not on file  Social History Narrative  . Not on file         Objective:  Physical Exam: BP (!) 146/86 (BP Location: Left Arm, Patient Position: Sitting, Cuff Size: Normal)   Pulse 93   Temp 98.3 F (36.8 C) (Oral)   Ht '5\' 10"'  (1.778 m)   Wt 224 lb 9.6 oz (101.9 kg)   SpO2 98%   BMI 32.23 kg/m   Gen: NAD, resting comfortably CV: Regular rate and rhythm with no murmurs appreciated Pulm: Normal work of breathing, clear to auscultation bilaterally with no crackles, wheezes, or rhonchi GI: Normal bowel sounds present. Soft, Nontender, Nondistended. MSK: No edema, cyanosis, or clubbing noted Skin: Warm, dry Neuro: Grossly normal, moves all extremities Psych: Normal affect and thought content       Arpan Eskelson M. Jerline Pain, MD 01/12/2019 3:38 PM

## 2019-01-13 NOTE — Progress Notes (Signed)
Dr Lavone Neri interpretation of your lab work:  Good news! Your blood work is NORMAL. We do not need to do any further testing at this time.    If you have any additional questions, please give [place] a call or send [place] a message through [place].  Take care, Dr [name]

## 2019-02-18 ENCOUNTER — Telehealth: Payer: Self-pay | Admitting: Oncology

## 2019-02-18 NOTE — Telephone Encounter (Signed)
Pt has cld to schedule a hem appt. Appt has been scheduled for the to see Dr. Clelia Croft on 5/12 at 11am. Pt aware to arrive 15 minutes early.

## 2019-03-03 ENCOUNTER — Other Ambulatory Visit: Payer: Self-pay

## 2019-03-03 ENCOUNTER — Inpatient Hospital Stay: Payer: 59 | Attending: Oncology | Admitting: Oncology

## 2019-03-03 VITALS — BP 149/89 | HR 89 | Temp 98.1°F | Resp 18 | Ht 70.0 in | Wt 231.0 lb

## 2019-03-03 DIAGNOSIS — R030 Elevated blood-pressure reading, without diagnosis of hypertension: Secondary | ICD-10-CM

## 2019-03-03 DIAGNOSIS — Z7901 Long term (current) use of anticoagulants: Secondary | ICD-10-CM | POA: Insufficient documentation

## 2019-03-03 DIAGNOSIS — I2699 Other pulmonary embolism without acute cor pulmonale: Secondary | ICD-10-CM | POA: Insufficient documentation

## 2019-03-03 DIAGNOSIS — D6852 Prothrombin gene mutation: Secondary | ICD-10-CM | POA: Diagnosis not present

## 2019-03-03 DIAGNOSIS — Z79899 Other long term (current) drug therapy: Secondary | ICD-10-CM | POA: Diagnosis not present

## 2019-03-03 DIAGNOSIS — D6851 Activated protein C resistance: Secondary | ICD-10-CM | POA: Insufficient documentation

## 2019-03-03 NOTE — Progress Notes (Signed)
Reason for the request: Pulmonary embolism     HPI: I was asked by Dr. [name] to evaluate David Reid for a recent pulmonary embolism.  He is a 28 year old man currently of [place] who presented acutely on March 2 of 2020 with symptoms of chest pain and shortness of breath.  In the early part of February he did at that travel to [place] and was on a long flight and subsequently traveled to [place] in the later part of February.  He started developing intermittent fevers body aches and subsequently back pain.  During his hospitalization on March 2, and at that time airspace consolidation with extensive groundglass attenuation throughout the left lung was noted that could reflect pneumonia.  There is also a filling defect in the pulmonary artery extending that could reflect pulmonary embolism.  However the study was limited in a repeat study on March 3 confirmed the presence of bilateral pulmonary emboli.  He was started on heparin and subsequently was discharged on Eliquis which she has been on it since that time.  His symptoms has improved without any chest pain or shortness of breath after that.  He denies any bleeding issues since that time on Eliquis.  He denies any personal history of any thrombosis but does have family history on his mother's side predominantly with arterial blood clots.  His mother is on aspirin although the nature of her thrombosis is unclear.  Hypercoagulable panel was obtained on him during his hospitalization which showed heterozygous mutation with factor V Leiden as well as prothrombin gene mutation.   He does not report any headaches, blurry vision, syncope or seizures. Does not report any fevers, chills or sweats.  Does not report any cough, wheezing or hemoptysis.  Does not report any chest pain, palpitation, orthopnea or leg edema.  Does not report any nausea, vomiting or abdominal pain.  Does not report any constipation or diarrhea.  Does not report any skeletal complaints.     Does not report frequency, urgency or hematuria.  Does not report any skin rashes or lesions. Does not report any heat or cold intolerance.  Does not report any lymphadenopathy or petechiae.  Does not report any anxiety or depression.  Remaining review of systems is negative.    No past medical history on file.:  Past Surgical History:  Procedure Laterality Date  . WISDOM TOOTH EXTRACTION    :   Current Outpatient Medications:  .  apixaban (ELIQUIS) 5 MG TABS tablet, Take 1 tablet (5 mg total) by mouth 2 (two) times daily., Disp: 180 tablet, Rfl: 1 .  Multiple Vitamin (MULTIVITAMIN WITH MINERALS) TABS tablet, Take 1 tablet by mouth daily., Disp: , Rfl: :  No Known Allergies:  Family History  Problem Relation Age of Onset  . Hypertension Father   . Diabetes Mother   :  Social History   Socioeconomic History  . Marital status: Significant Other    Spouse name: Not on file  . Number of children: Not on file  . Years of education: Not on file  . Highest education level: Not on file  Occupational History  . Not on file  Social Needs  . Financial resource strain: Not on file  . Food insecurity:    Worry: Not on file    Inability: Not on file  . Transportation needs:    Medical: Not on file    Non-medical: Not on file  Tobacco Use  . Smoking status: Never Smoker  . Smokeless tobacco: Never  Used  Substance and Sexual Activity  . Alcohol use: Yes  . Drug use: Never  . Sexual activity: Yes    Partners: Female  Lifestyle  . Physical activity:    Days per week: Not on file    Minutes per session: Not on file  . Stress: Not on file  Relationships  . Social connections:    Talks on phone: Not on file    Gets together: Not on file    Attends religious service: Not on file    Active member of club or organization: Not on file    Attends meetings of clubs or organizations: Not on file    Relationship status: Not on file  . Intimate partner violence:    Fear of current  or ex partner: Not on file    Emotionally abused: Not on file    Physically abused: Not on file    Forced sexual activity: Not on file  Other Topics Concern  . Not on file  Social History Narrative  . Not on file  :  Pertinent items are noted in HPI.  Exam: Blood pressure (!) 149/89, pulse 89, temperature 98.1 F (36.7 C), temperature source Oral, resp. rate 18, height  (1.778 m), weight 231 lb (104.8 kg), SpO2 100 %.    General appearance: alert and cooperative appeared without distress. Head: atraumatic without any abnormalities. Eyes: conjunctivae/corneas clear. PERRL.  Sclera anicteric. Throat: lips, mucosa, and tongue normal; without oral thrush or ulcers. Resp: clear to auscultation bilaterally without rhonchi, wheezes or dullness to percussion. Cardio: regular rate and rhythm, S1, S2 normal, no murmur, click, rub or gallop GI: soft, non-tender; bowel sounds normal; no masses,  no organomegaly Skin: Skin color, texture, turgor normal. No rashes or lesions Lymph nodes: Cervical, supraclavicular, and axillary nodes normal. Neurologic: Grossly normal without any motor, sensory or deep tendon reflexes. Musculoskeletal: No joint deformity or effusion.  CBC    Component Value Date/Time   WBC 5.0 01/12/2019 1455   RBC 4.99 01/12/2019 1455   HGB 15.1 01/12/2019 1455   HCT 43.1 01/12/2019 1455   PLT 268.0 01/12/2019 1455   MCV 86.4 01/12/2019 1455   MCH 30.0 12/24/2018 0546   MCHC 35.0 01/12/2019 1455   RDW 13.1 01/12/2019 1455   LYMPHSABS 1.6 12/22/2018 1206   MONOABS 1.7 (H) 12/22/2018 1206   EOSABS 0.0 12/22/2018 1206   BASOSABS 0.0 12/22/2018 1206      Chemistry      Component Value Date/Time   NA 139 01/12/2019 1455   K 4.3 01/12/2019 1455   CL 103 01/12/2019 1455   CO2 28 01/12/2019 1455   BUN 16 01/12/2019 1455   CREATININE 1.02 01/12/2019 1455      Component Value Date/Time   CALCIUM 9.6 01/12/2019 1455   ALKPHOS 57 01/12/2019 1455   AST 20  01/12/2019 1455   ALT 45 01/12/2019 1455   BILITOT 0.4 01/12/2019 1455       Assessment and Plan:    28 year old man with the following:  1.  Bilateral pulmonary emboli diagnosed in March 2020 after presenting with chest pain and shortness of breath.  This occurred in the setting of a multiple long flights and certainly provoked.  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  The natural course of this disease as well as management options were reviewed today with the patient.  The ramification of the genetic mutations were discussed extensively today.  He clearly  had a provoked thrombosis that requires anticoagulation for at least next months.  Longer anticoagulation after a year will be reasonable given the fact that he is high risk of recurrent thrombosis.  He understands that the fact that he has combined heterozygous mutation puts him at increased risk than the general population of at least 5 fold.    Risks and benefits of continuing anticoagulation beyond 1 year was discussed today.  The risk of bleeding certainly increases and given his young age the duration of anticoagulation would be very long given the extent of his life span.  After discussion today, I have recommended continuing 1 year of anticoagulation and discontinuation after that.  We also discussed strategies to mitigate the risk of thrombosis in situations where the risk is high.  I have encouraged increased mobility during long flights, adequate hydration and early mobilization after surgery.  He understands also if he develops any recurrent thrombosis in the future, lifetime anticoagulation may be needed.  2.  Follow-up: I am happy to see him in the future as needed.  40  minutes was spent with the patient face-to-face today.  More than 50% of time was spent on reviewing his disease status, treatment options complications related therapy.    Thank you for the referral.  I had  the pleasure of meeting this patient today.  A copy of this consult has been forwarded to the requesting physician.

## 2019-04-09 ENCOUNTER — Ambulatory Visit (INDEPENDENT_AMBULATORY_CARE_PROVIDER_SITE_OTHER): Payer: 59 | Admitting: Family Medicine

## 2019-04-09 ENCOUNTER — Other Ambulatory Visit: Payer: Self-pay

## 2019-04-09 ENCOUNTER — Encounter: Payer: Self-pay | Admitting: Family Medicine

## 2019-04-09 VITALS — BP 148/96 | HR 96 | Temp 98.8°F | Ht 70.0 in | Wt 236.2 lb

## 2019-04-09 DIAGNOSIS — R030 Elevated blood-pressure reading, without diagnosis of hypertension: Secondary | ICD-10-CM

## 2019-04-09 DIAGNOSIS — R05 Cough: Secondary | ICD-10-CM

## 2019-04-09 DIAGNOSIS — I2699 Other pulmonary embolism without acute cor pulmonale: Secondary | ICD-10-CM | POA: Diagnosis not present

## 2019-04-09 DIAGNOSIS — Z23 Encounter for immunization: Secondary | ICD-10-CM | POA: Diagnosis not present

## 2019-04-09 DIAGNOSIS — D6859 Other primary thrombophilia: Secondary | ICD-10-CM | POA: Diagnosis not present

## 2019-04-09 DIAGNOSIS — R059 Cough, unspecified: Secondary | ICD-10-CM

## 2019-04-09 NOTE — Progress Notes (Signed)
   Chief Complaint:  David Reid is a 28 y.o. male who presents today with a chief complaint of cough.   Assessment/Plan:  Cough Discussed limitations of antibody testing for COVID.  Patient voiced understanding and wished to proceed.  We will check COVID antibody.  Elevated blood pressure reading Elevated today.  Reported blood pressures are better.  Advised him to continue checking home blood pressures with goal 140/90 or lower.  If he has persistently elevated blood pressure readings, would need to start low-dose antihypertensive, likely amlodipine.  He will follow-up with me in a few weeks if blood pressures are persistently elevated.  Hypercoagulable state [facility]) Discussed pros and cons of lifelong anticoagulation.  He is leaning toward short-term anticoagulation at this point.  He will make a final decision in the next few months.  Continue Eliquis 5 mg twice daily in the meantime.  Pulmonary embolism (HCC) No signs of recurrence.  He wishes to continue Eliquis 5 g twice daily for at least 1 year and then stop.  Preventative Healthcare Tdap given today.     Subjective:  HPI:  Cough Patient was admitted to the hospital about 3 months ago with cough and fever.  His ultimately diagnosed with PE however is concerned that he possibly could have COVID pneumonia.  Symptoms have since resolved however is interested in getting antibody testing.  Is not currently having any symptoms.  # PE / Hypercoagulable state -Has seen oncology in the past for this.  He is currently anticoagulated on Eliquis 5 mg twice daily.  He is currently debating about doing lifelong anticoagulation or only 1 year of anticoagulation as recommended by.  #Elevated blood pressure readings Patient has been checking blood pressures at home and they have been typically in the low 140s over 90s.  Not currently having any reported chest pain or shortness of breath.  He has not been exercising or watching his diet like  he should be.  ROS: Per HPI  PMH: He reports that he has never smoked. He has never used smokeless tobacco. He reports current alcohol use. He reports that he does not use drugs.      Objective:  Physical Exam: BP (!) 148/96 (BP Location: Left Arm, Patient Position: Sitting, Cuff Size: Normal)   Pulse 96   Temp 98.8 F (37.1 C) (Oral)   Ht 5\' 10"  (1.778 m)   Wt 236 lb 4 oz (107.2 kg)   SpO2 98%   BMI 33.90 kg/m   Gen: NAD, resting comfortably CV: Regular rate and rhythm with no murmurs appreciated Pulm: Normal work of breathing, clear to auscultation bilaterally with no crackles, wheezes, or rhonchi       Yonah Tangeman M. Jerline Pain, MD 04/09/2019 2:26 PM

## 2019-04-09 NOTE — Assessment & Plan Note (Signed)
No signs of recurrence.  He wishes to continue Eliquis 5 g twice daily for at least 1 year and then stop.

## 2019-04-09 NOTE — Patient Instructions (Signed)
It was very nice to see you today!  We will did blood work today to check for [place].  Please keep an eye on your blood pressure does let me know if persistently 140/90 or higher.  Please let you know if you have any recurrence of your blood clot.  Take care, Dr Jerline Pain

## 2019-04-09 NOTE — Assessment & Plan Note (Signed)
Discussed pros and cons of lifelong anticoagulation.  He is leaning toward short-term anticoagulation at this point.  He will make a final decision in the next few months.  Continue Eliquis 5 mg twice daily in the meantime.

## 2019-04-10 LAB — SAR COV2 SEROLOGY (COVID19)AB(IGG),IA: SARS CoV2 AB IGG: NEGATIVE

## 2019-04-10 NOTE — Progress Notes (Signed)
Please inform patient of the following:  COVID antibody test is negative. This means he has not been exposed to the virus and is still at risk for infection.   David Reid. Jerline Pain, MD 04/10/2019 1:37 PM

## 2019-07-08 ENCOUNTER — Other Ambulatory Visit: Payer: Self-pay | Admitting: Family Medicine

## 2019-12-31 ENCOUNTER — Telehealth: Payer: Self-pay

## 2019-12-31 NOTE — Telephone Encounter (Signed)
Patient requesting a refill on Eliquis 5 MG Twice Daily last OV states use for 6 months referral for  long term use. Please Advise

## 2020-01-01 ENCOUNTER — Other Ambulatory Visit: Payer: Self-pay | Admitting: *Deleted

## 2020-01-01 MED ORDER — APIXABAN 5 MG PO TABS: 5.0000 mg | ORAL_TABLET | Freq: Two times a day (BID) | ORAL | 1 refills | Status: DC

## 2020-01-04 NOTE — Telephone Encounter (Signed)
Ok with me. Please place any necessary orders. 

## 2020-12-01 IMAGING — CT CT ANGIO CHEST
2 of 7 series · 17 of 46 positions shown · IV contrast (omnipaque)
Comparison: No priors.

CLINICAL DATA: 27-year-old male with history of persistent cough
for the past 3 weeks. Shortness of breath worsening over the past 2
days.

EXAM:
CT ANGIOGRAPHY CHEST WITH CONTRAST
TECHNIQUE: Multidetector CT imaging of the chest was performed using the
standard protocol during bolus administration of intravenous
contrast. Multiplanar CT image reconstructions and MIPs were
obtained to evaluate the vascular anatomy.
CONTRAST:  100mL OMNIPAQUE IOHEXOL 350 MG/ML SOLN

[Series 5: thins · axial · 0.93mm/px · z∈[-327,-53]mm · 14 of 312 slices shown]
[im 19/312  lung]
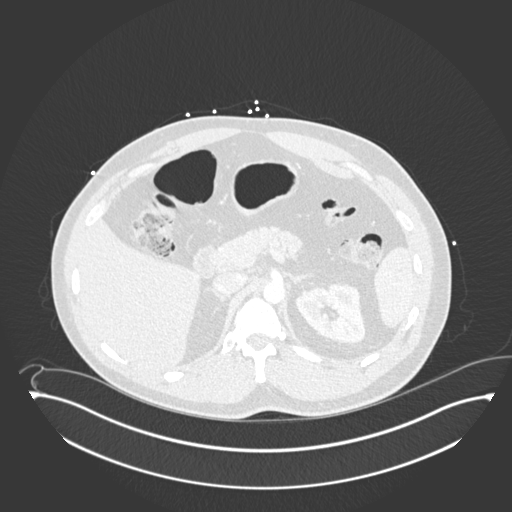
[im 37/312  soft-tissue]
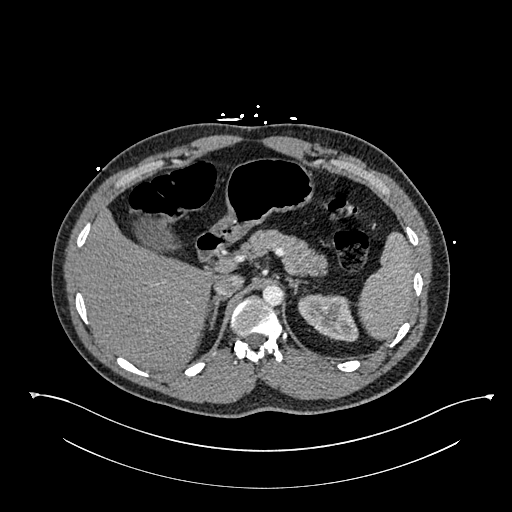
[im 55/312  lung]
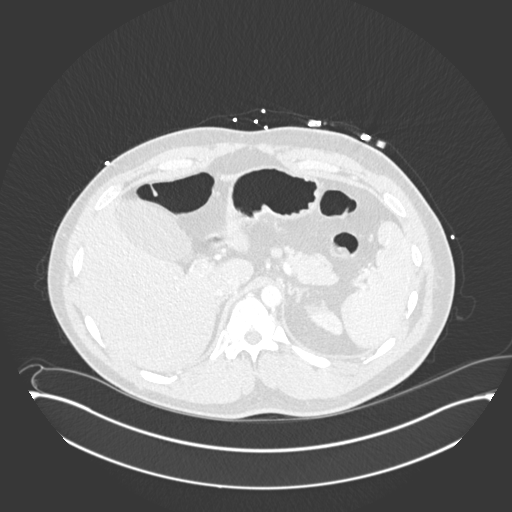
[im 92/312  soft-tissue]
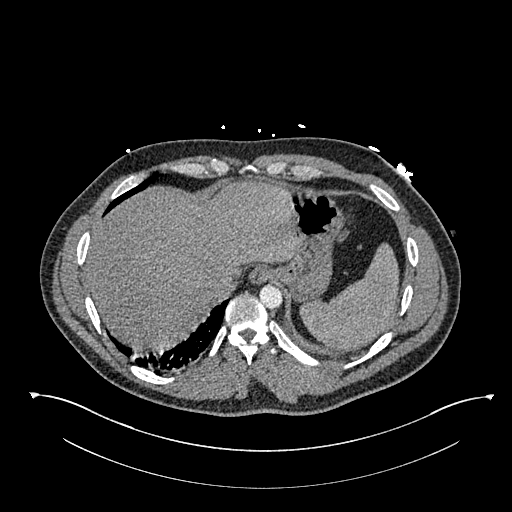
[im 110/312  lung]
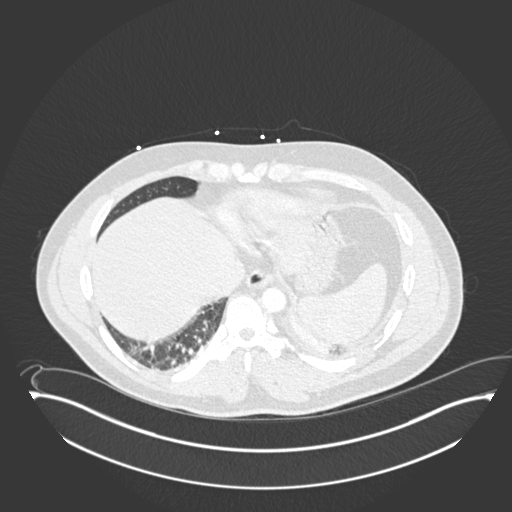
[im 129/312  soft-tissue]
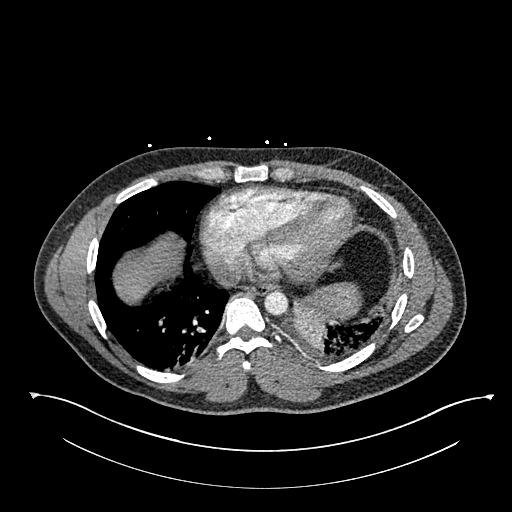
[im 147/312  lung]
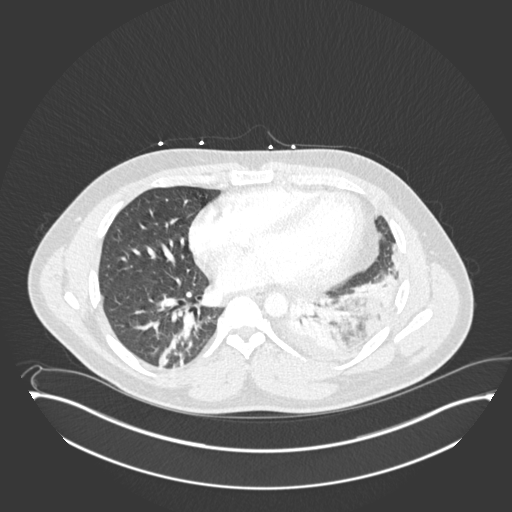
[im 165/312  soft-tissue]
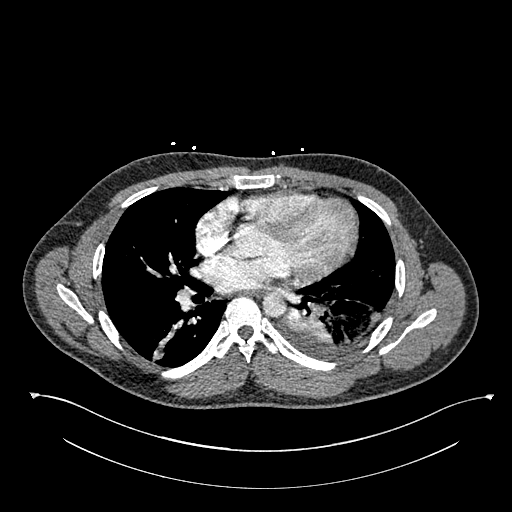
[im 183/312  lung]
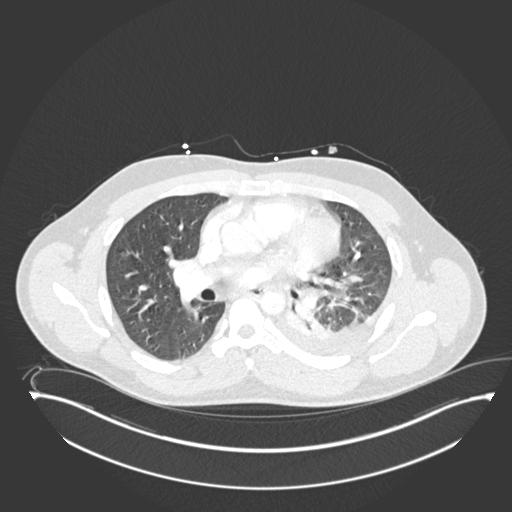
[im 202/312  soft-tissue]
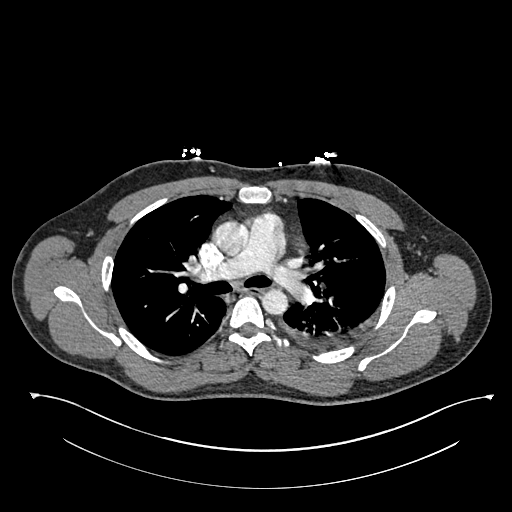
[im 238/312  lung]
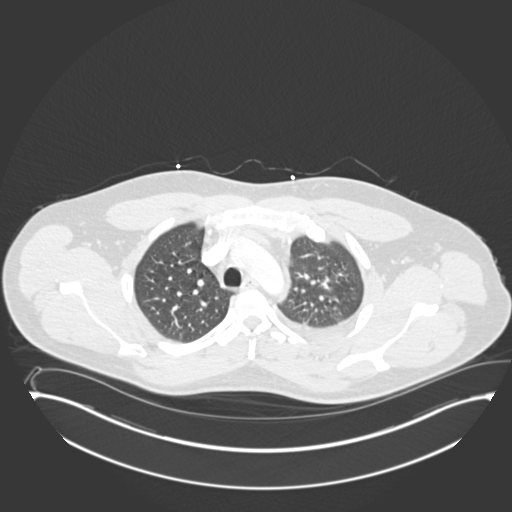
[im 257/312  soft-tissue]
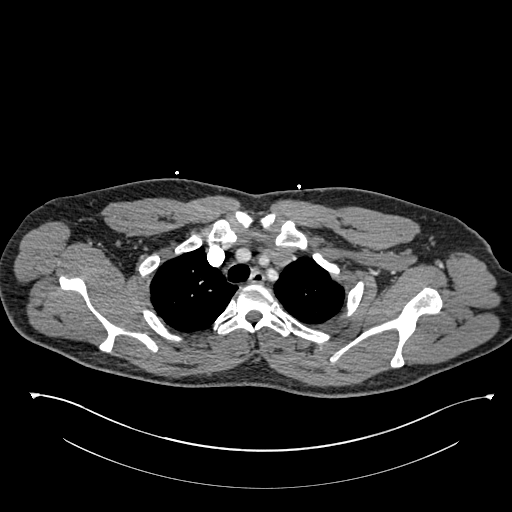
[im 275/312  lung]
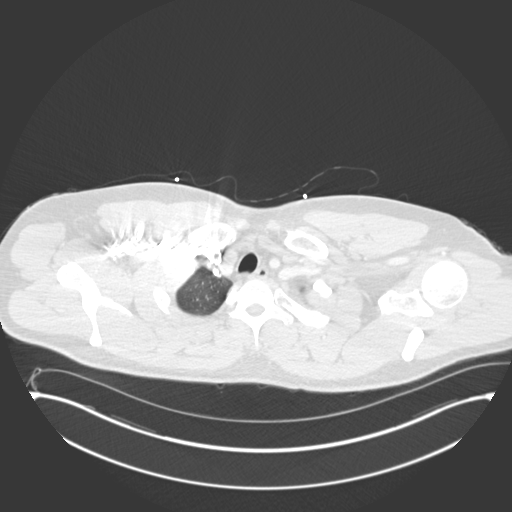
[im 293/312  soft-tissue]
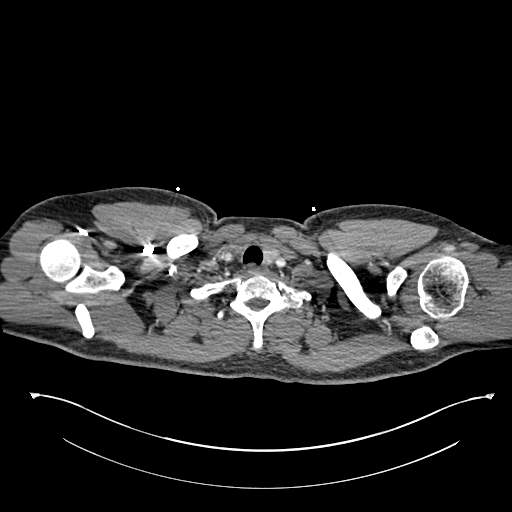

[Series 6: coronal mpr · coronal · 0.62mm/px · 3 of 142 slices shown]
[im 36/142  soft-tissue]
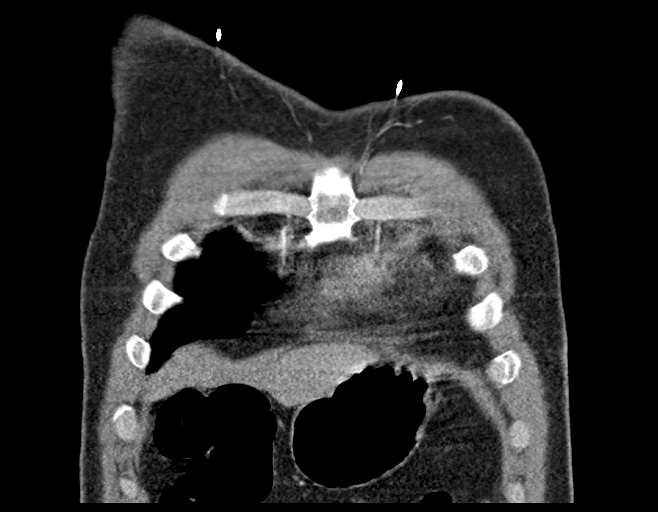
[im 71/142  soft-tissue]
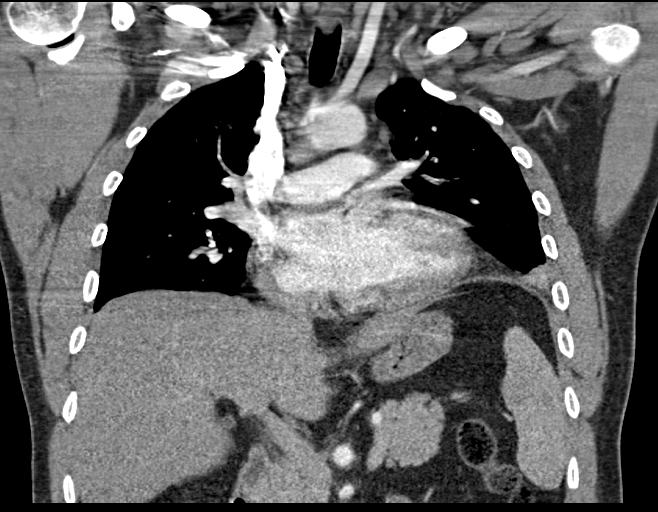
[im 106/142  soft-tissue]
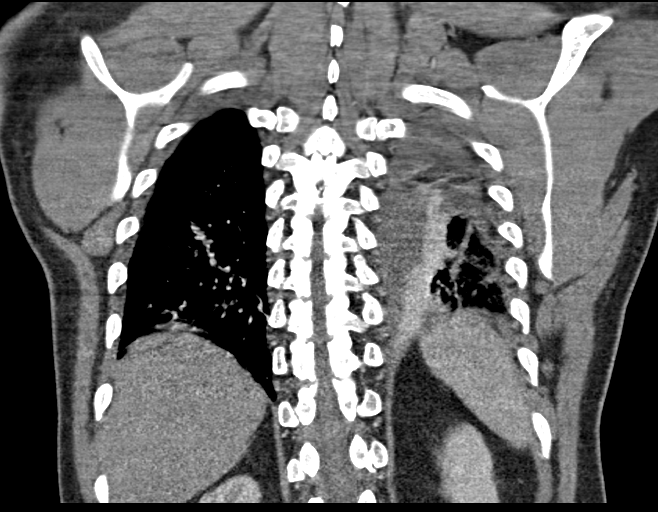

[17 of 46 positions shown; findings below may reference images not displayed]

FINDINGS: Comment: Today's study is severely limited by a large amount of
patient respiratory motion.

Cardiovascular: There are potential filling defects within segmental
sized pulmonary artery branches to the lower lobes of the lungs
bilaterally, but assessment is severely limited by extensive patient
respiratory motion. No larger central filling defects are noted.
Heart size is normal. There is no significant pericardial fluid,
thickening or pericardial calcification.

Mediastinum/Nodes: No pathologically enlarged mediastinal or hilar
lymph nodes. Esophagus is unremarkable in appearance. No axillary
lymphadenopathy.

Lungs/Pleura: There is a combination of airspace consolidation and
ground-glass attenuation throughout the left lower lobe. Some
airspace consolidation is also noted in the posterior aspect of the
left upper lobe. Volume loss in the right lower lobe. Small left
pleural effusion lying dependently. No definite suspicious appearing
pulmonary nodules or masses are noted.

Upper Abdomen: Unremarkable.

Musculoskeletal: There are no aggressive appearing lytic or blastic
lesions noted in the visualized portions of the skeleton.

Review of the MIP images confirms the above findings.
IMPRESSION: 1. Airspace consolidation and extensive ground-glass attenuation
throughout the left lower lobe, and to a lesser extent in the
posterior aspect of the left upper lobe. If there are infectious
symptoms, this may simply reflect pneumonia. However, there are
potential filling defects in the pulmonary arteries extending to
this distribution (as well as in the right lower lobe to a lesser
extent) which could reflect pulmonary embolism. Unfortunately,
today's study is extremely limited by patient respiratory motion
such that definitive diagnosis of pulmonary embolism is not
possible.
2. Small left pleural effusion lying dependently.

These results were called by telephone at the time of interpretation
on 12/22/2018 at [DATE] to Dr. ALIA TIGER, who verbally
acknowledged these results.

## 2020-12-02 IMAGING — CT CT ANGIO CHEST
2 of 6 series · 18 of 46 positions shown · IV contrast (OMNIPAQUE)
Comparison: Exam from the previous day.

CLINICAL DATA: Shortness of breath and limited previous CTA

EXAM:
CT ANGIOGRAPHY CHEST WITH CONTRAST
TECHNIQUE: Multidetector CT imaging of the chest was performed using the
standard protocol during bolus administration of intravenous
contrast. Multiplanar CT image reconstructions and MIPs were
obtained to evaluate the vascular anatomy.
CONTRAST:  100mL OMNIPAQUE IOHEXOL 350 MG/ML SOLN

[Series 5: thins · axial · 0.88mm/px · z∈[-335,-62]mm · 16 of 300 slices shown]
[im 14/300  lung]
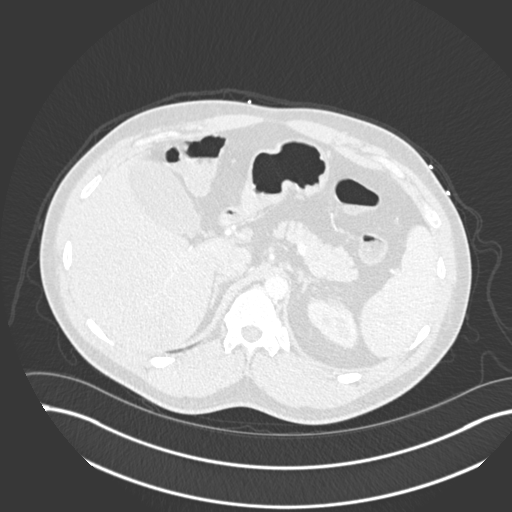
[im 40/300  soft-tissue]
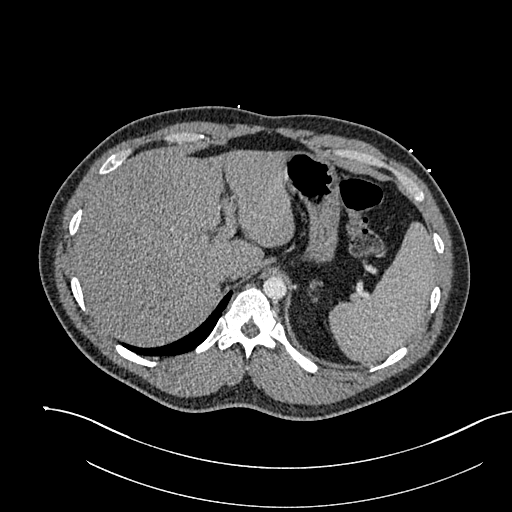
[im 53/300  lung]
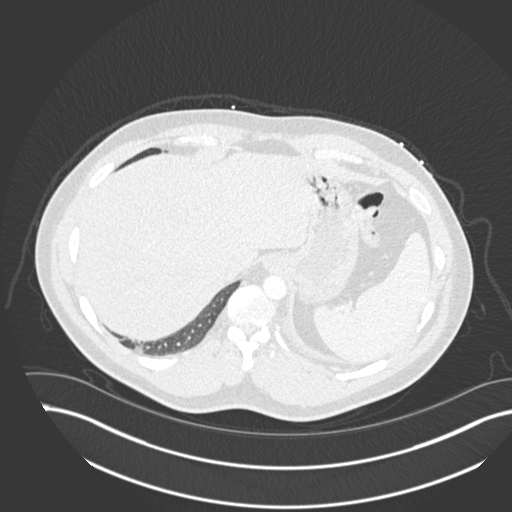
[im 66/300  soft-tissue]
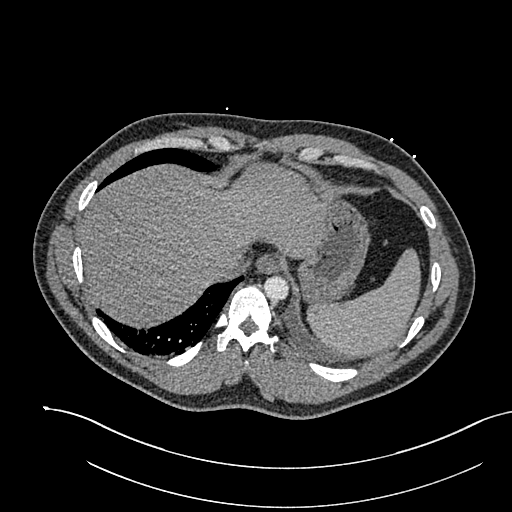
[im 92/300  lung]
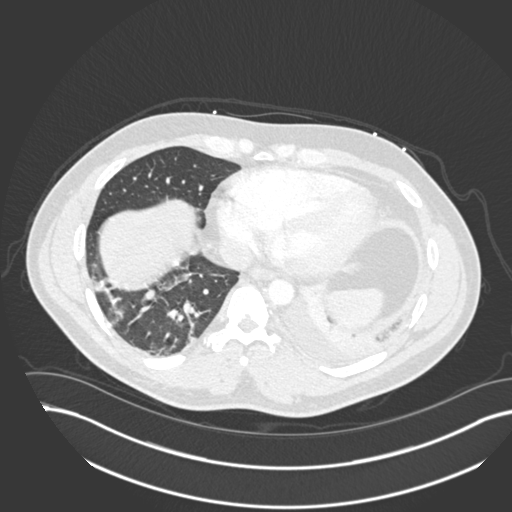
[im 105/300  soft-tissue]
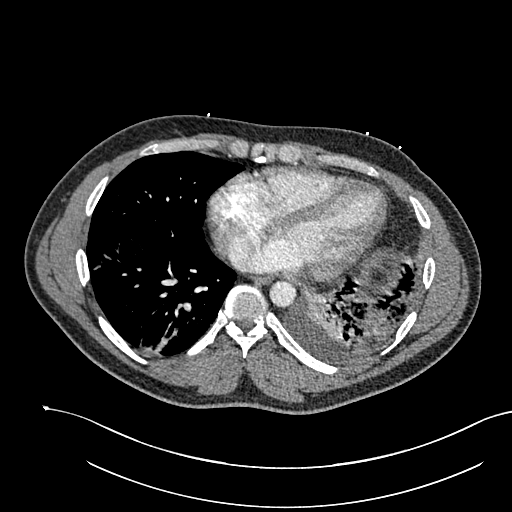
[im 118/300  lung]
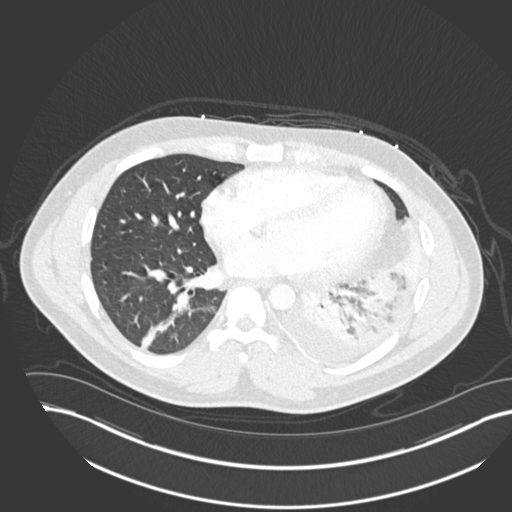
[im 144/300  soft-tissue]
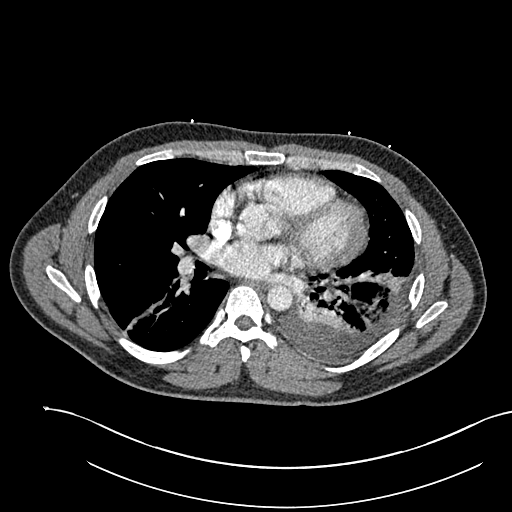
[im 157/300  lung]
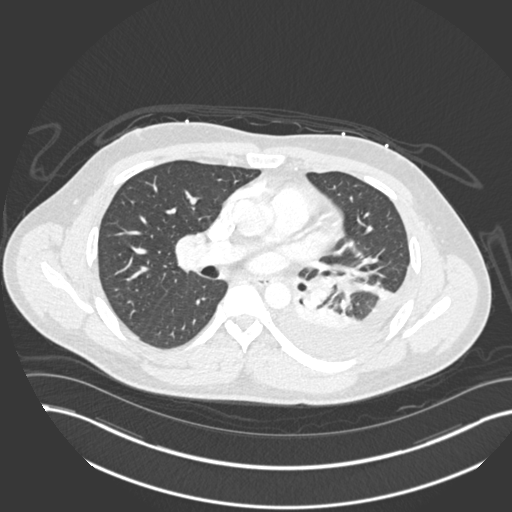
[im 183/300  soft-tissue]
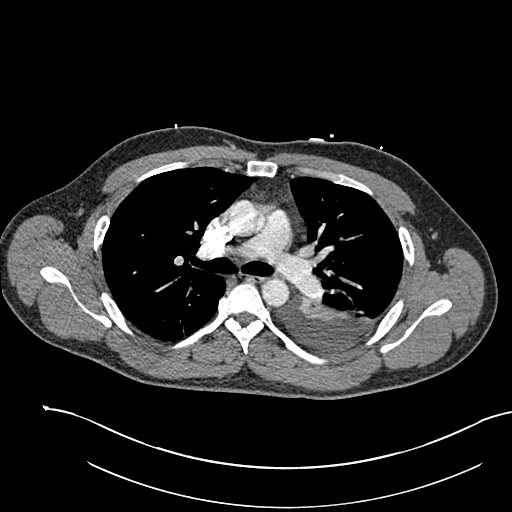
[im 196/300  lung]
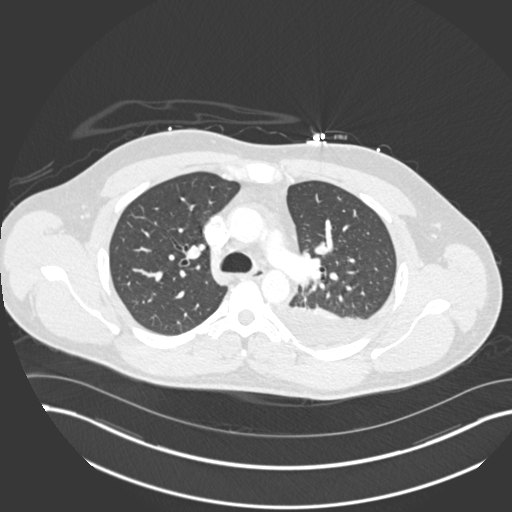
[im 209/300  soft-tissue]
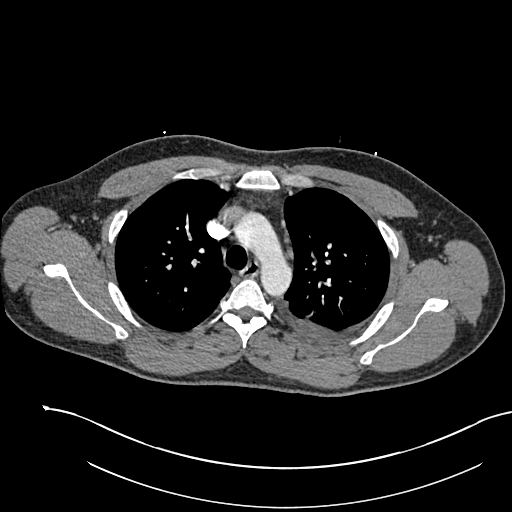
[im 235/300  lung]
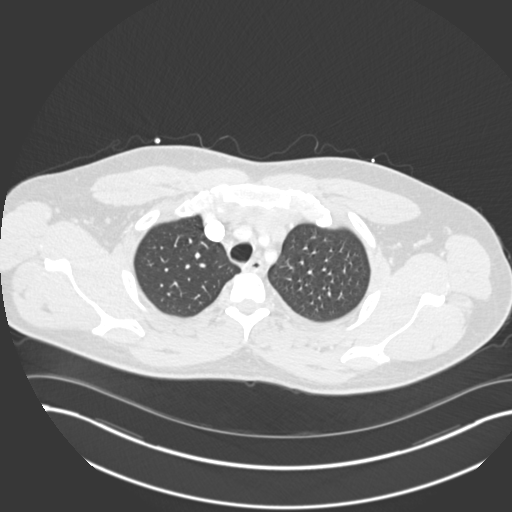
[im 248/300  soft-tissue]
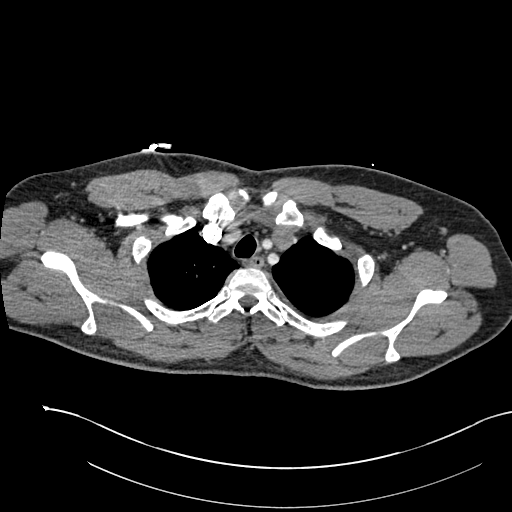
[im 261/300  lung]
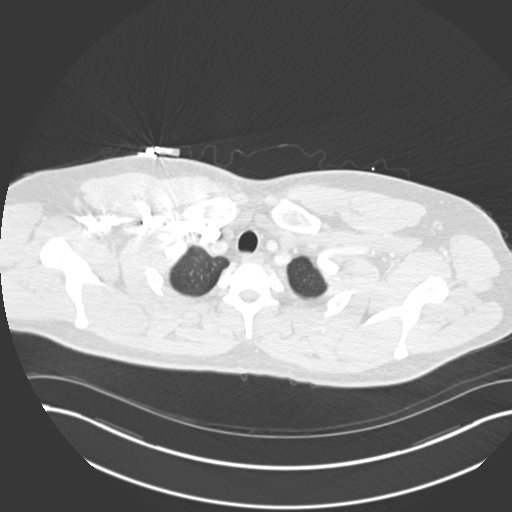
[im 287/300  soft-tissue]
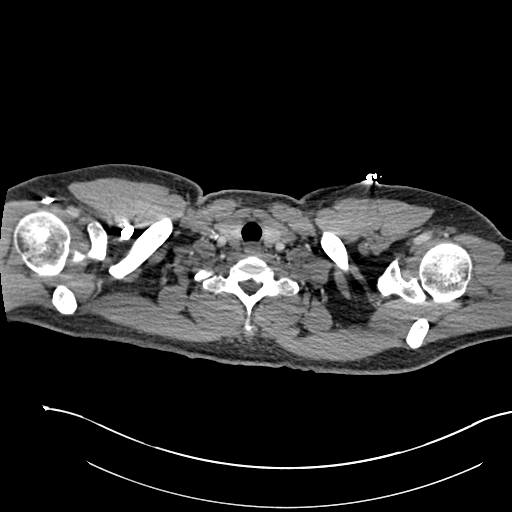

[Series 7: coronal mpr · coronal · 0.61mm/px · 2 of 106 slices shown]
[im 36/106  soft-tissue]
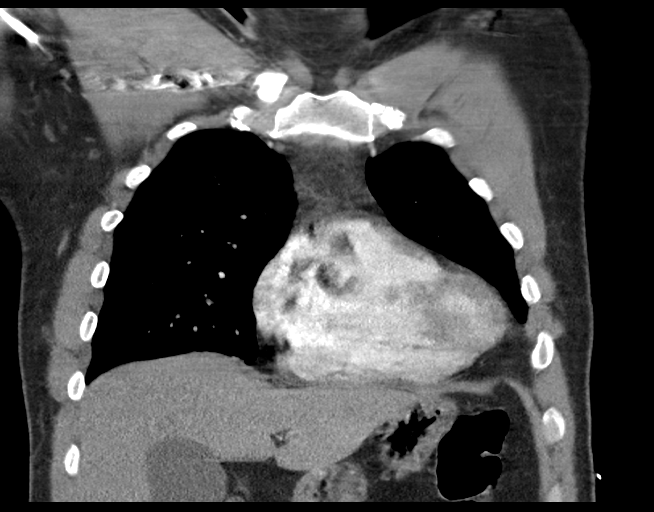
[im 71/106  soft-tissue]
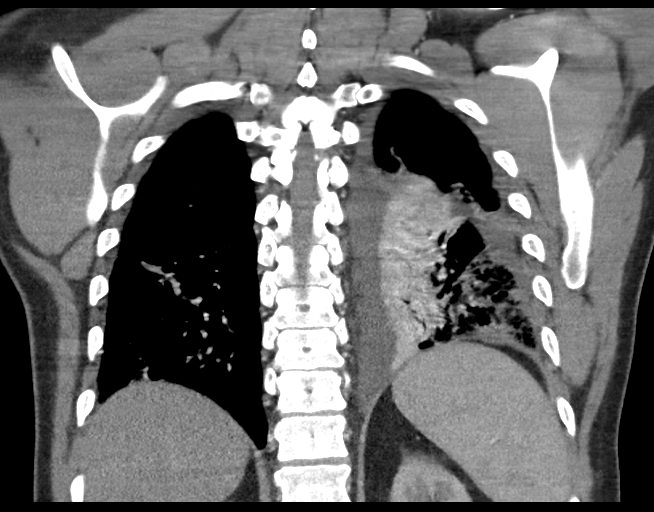

[18 of 46 positions shown; findings below may reference images not displayed]

FINDINGS: Cardiovascular: Thoracic aorta is again within normal limits.
Pulmonary artery is well visualized although the enhancement pattern
is limited. Filling defects are noted in the pulmonary artery
bilaterally consistent with pulmonary emboli. These are better
visualized than on the previous day. No pulmonary arterial
enlargement is seen. The heart is mildly prominent. No coronary
calcifications are seen.

Mediastinum/Nodes: Thoracic inlet is within normal limits.

Lungs/Pleura: Right lung is well aerated with the exception of
minimal lower lobe atelectasis. More marked infiltrate is noted in
the left lower lobe similar to that seen on the prior exam with
associated small effusion. No definitive nodular changes are seen.

Upper Abdomen: Visualized upper abdomen is within normal limits.

Musculoskeletal: Bony structures are within normal limits stable
from the previous study.

Review of the MIP images confirms the above findings.
IMPRESSION: Changes consistent with bilateral pulmonary emboli.

Infiltrate is again seen in the left base with associated effusions
stable from the previous day.

## 2021-12-19 ENCOUNTER — Other Ambulatory Visit: Payer: Self-pay

## 2021-12-19 ENCOUNTER — Telehealth (INDEPENDENT_AMBULATORY_CARE_PROVIDER_SITE_OTHER): Payer: 59 | Admitting: Family Medicine

## 2021-12-19 ENCOUNTER — Encounter: Payer: Self-pay | Admitting: Family Medicine

## 2021-12-19 DIAGNOSIS — I10 Essential (primary) hypertension: Secondary | ICD-10-CM

## 2021-12-19 MED ORDER — AMLODIPINE BESYLATE 5 MG PO TABS: 5.0000 mg | ORAL_TABLET | Freq: Every day | ORAL | 3 refills | Status: DC

## 2021-12-19 NOTE — Progress Notes (Signed)
° °  David Reid is a 31 y.o. male who presents today for a virtual office visit.  Assessment/Plan:  Chronic Problems Addressed Today: Essential hypertension Reported blood pressures are above goal.  He does have a strong family history component as well.  We discussed treatment options.  We will start amlodipine 5 mg daily.  We discussed potential side effects.  He will continue monitoring at home and let us know if persistently elevated.  We also discussed lifestyle modifications.  He is under quite a bit of stress due to his work schedule currently.  We discussed low-sodium diet and regular exercise.  We will place referral to nutritionist for further discussion.  He will follow-up in a couple of weeks via MyChart.  He will come back in a couple of months for annual physical with labs.     Subjective:  HPI:  Patient here with concerns for elevated blood pressure reading.  He has been having home readings in the 140s to 150s.  He has been under a lot of stress.  Is currently working 2 jobs and is over 60 hours/week.  This makes it very difficult for him to make good food choices.  Does not get much exercise either.  No reported chest pain or shortness of breath.  No reported headache.  Does have a family history of elevated blood pressure reading in his father.  He has never been on medications in the past.        Objective/Observations  Physical Exam: Gen: NAD, resting comfortably Pulm: Normal work of breathing Neuro: Grossly normal, moves all extremities Psych: Normal affect and thought content  Virtual Visit via Video   I connected with David Reid on 12/19/21 at  4:00 PM EST by a video enabled telemedicine application and verified that I am speaking with the correct person using two identifiers. The limitations of evaluation and management by telemedicine and the availability of in person appointments were discussed. The patient expressed understanding and agreed to proceed.    Patient location: Home Provider location: [place] participating in the virtual visit: Myself and Patient     David Reid. Jerline Pain, MD 12/19/2021 4:04 PM

## 2021-12-19 NOTE — Assessment & Plan Note (Signed)
Reported blood pressures are above goal.  He does have a strong family history component as well.  We discussed treatment options.  We will start amlodipine 5 mg daily.  We discussed potential side effects.  He will continue monitoring at home and let us know if persistently elevated.  We also discussed lifestyle modifications.  He is under quite a bit of stress due to his work schedule currently.  We discussed low-sodium diet and regular exercise.  We will place referral to nutritionist for further discussion.  He will follow-up in a couple of weeks via MyChart.  He will come back in a couple of months for annual physical with labs.

## 2022-07-16 ENCOUNTER — Encounter: Payer: Self-pay | Admitting: *Deleted

## 2022-10-04 ENCOUNTER — Encounter: Payer: Self-pay | Admitting: *Deleted

## 2022-12-24 ENCOUNTER — Other Ambulatory Visit: Payer: Self-pay | Admitting: Family Medicine

## 2023-01-21 ENCOUNTER — Other Ambulatory Visit: Payer: Self-pay | Admitting: Family Medicine

## 2023-07-30 ENCOUNTER — Other Ambulatory Visit: Payer: Self-pay | Admitting: Family Medicine

## 2023-07-31 ENCOUNTER — Encounter: Payer: Self-pay | Admitting: Family Medicine

## 2023-08-01 NOTE — Telephone Encounter (Signed)
Please advise if ok to send 30 day refill, patient not seen since 12/19/21 via virtual visit.

## 2023-08-02 ENCOUNTER — Other Ambulatory Visit: Payer: Self-pay | Admitting: *Deleted

## 2023-08-02 MED ORDER — AMLODIPINE BESYLATE 5 MG PO TABS: 5.0000 mg | ORAL_TABLET | Freq: Every day | ORAL | 0 refills | Status: DC

## 2023-08-02 NOTE — Telephone Encounter (Signed)
Ok to send in 30 day supply but needs to keep follow up appointment.  David Reid. [name], MD 08/02/2023 8:27 AM

## 2023-09-01 ENCOUNTER — Other Ambulatory Visit: Payer: Self-pay | Admitting: Family Medicine

## 2023-09-03 ENCOUNTER — Encounter: Payer: Self-pay | Admitting: Family Medicine

## 2023-09-03 ENCOUNTER — Ambulatory Visit (INDEPENDENT_AMBULATORY_CARE_PROVIDER_SITE_OTHER): Payer: 59 | Admitting: Family Medicine

## 2023-09-03 VITALS — BP 151/88 | HR 81 | Temp 97.5°F | Ht 70.0 in | Wt 251.8 lb

## 2023-09-03 DIAGNOSIS — I10 Essential (primary) hypertension: Secondary | ICD-10-CM

## 2023-09-03 DIAGNOSIS — D229 Melanocytic nevi, unspecified: Secondary | ICD-10-CM | POA: Diagnosis not present

## 2023-09-03 DIAGNOSIS — G4730 Sleep apnea, unspecified: Secondary | ICD-10-CM | POA: Diagnosis not present

## 2023-09-03 DIAGNOSIS — Z23 Encounter for immunization: Secondary | ICD-10-CM | POA: Diagnosis not present

## 2023-09-03 DIAGNOSIS — G4733 Obstructive sleep apnea (adult) (pediatric): Secondary | ICD-10-CM | POA: Insufficient documentation

## 2023-09-03 MED ORDER — AMLODIPINE BESYLATE 10 MG PO TABS: 10.0000 mg | ORAL_TABLET | Freq: Every day | ORAL | 3 refills | Status: DC

## 2023-09-03 NOTE — Assessment & Plan Note (Signed)
Will place referral to dermatology. . °

## 2023-09-03 NOTE — Assessment & Plan Note (Signed)
Based on history likely has OSA.  Will place referral for sleep study.

## 2023-09-03 NOTE — Patient Instructions (Signed)
It was very nice to see you today!  We will increase your amlodipine to 10 mg daily.  We will give your flu shot today.  I will refer you for a sleep study.  Will also refer you to see the dermatologist.  Please continue to monitor your blood pressure at home and let us know if it is persistently elevated.  I will see back in a few months for your annual physical.  Please come in to see me sooner if needed.  Return in about 3 months (around 12/04/2023) for Annual Physical.   Take care, Dr [name]  PLEASE NOTE:  If you had any lab tests, please let us know if you have not heard back within a few days. You may see your results on mychart before we have a chance to review them but we will give you a call once they are reviewed by [place].   If we ordered any referrals today, please let us know if you have not heard from their office within the next week.   If you had any urgent prescriptions sent in today, please check with the pharmacy within an hour of our visit to make sure the prescription was transmitted appropriately.   Please try these tips to maintain a healthy lifestyle:  Eat at least 3 REAL meals and 1-2 snacks per day.  Aim for no more than 5 hours between eating.  If you eat breakfast, please do so within one hour of getting up.   Each meal should contain half fruits/vegetables, one quarter protein, and one quarter carbs (no bigger than a computer mouse)  Cut down on sweet beverages. This includes juice, soda, and sweet tea.   Drink at least 1 glass of water with each meal and aim for at least 8 glasses per day  Exercise at least 150 minutes every week.

## 2023-09-03 NOTE — Progress Notes (Signed)
   David Reid is a 32 y.o. male who presents today for an office visit.  Assessment/Plan:  Chronic Problems Addressed Today: Essential hypertension Above goal today.  Will increase amlodipine to 10 mg daily.  Discussed lifestyle interventions.  He will continue to work on diet and exercise.  We are also treating his likely OSA as below which should help.  He will follow-up with [place] in a few weeks via MyChart.  He will come back in a few months for CPE and we can recheck blood pressure in office at that time.  Sleep-disordered breathing Based on history likely has OSA.  Will place referral for sleep study.  Multiple atypical skin moles Will place referral to dermatology.  Preventative Healthcare - Flu shot given today.     Subjective:  HPI:  See A/P for status of chronic conditions.  Patient is here today for follow-up.  We last saw him in the office over 4 years ago however he did have a virtual visit about 18 months ago. At that time we did start him on amlodipine. He has done well with this however his blood pressure is still in the 150's/80's.   He is also concerned about sleep apnea. Does note that he snores and wakes up a lot in the middle of the night. Feels restless at night. His girlfriend has seen him stop breathing at night.        Objective:  Physical Exam: BP (!) 152/84   Pulse 81   Temp (!) 97.5 F (36.4 C) (Temporal)   Ht 5\' 10"  (1.778 m)   Wt 251 lb 12.8 oz (114.2 kg)   SpO2 96%   BMI 36.13 kg/m   Gen: No acute distress, resting comfortably CV: Regular rate and rhythm with no murmurs appreciated Pulm: Normal work of breathing, clear to auscultation bilaterally with no crackles, wheezes, or rhonchi Neuro: Grossly normal, moves all extremities Psych: Normal affect and thought content      David Custis M. [name], MD 09/03/2023 10:15 AM

## 2023-09-03 NOTE — Assessment & Plan Note (Signed)
Above goal today.  Will increase amlodipine to 10 mg daily.  Discussed lifestyle interventions.  He will continue to work on diet and exercise.  We are also treating his likely OSA as below which should help.  He will follow-up with [place] in a few weeks via MyChart.  He will come back in a few months for CPE and we can recheck blood pressure in office at that time.

## 2023-10-10 ENCOUNTER — Institutional Professional Consult (permissible substitution): Payer: 59 | Admitting: Neurology

## 2023-10-29 ENCOUNTER — Encounter: Payer: Self-pay | Admitting: Neurology

## 2023-10-29 ENCOUNTER — Ambulatory Visit (INDEPENDENT_AMBULATORY_CARE_PROVIDER_SITE_OTHER): Payer: No Typology Code available for payment source | Admitting: Neurology

## 2023-10-29 VITALS — BP 138/94 | HR 79 | Ht 70.0 in | Wt 257.0 lb

## 2023-10-29 DIAGNOSIS — G4719 Other hypersomnia: Secondary | ICD-10-CM

## 2023-10-29 DIAGNOSIS — R0681 Apnea, not elsewhere classified: Secondary | ICD-10-CM

## 2023-10-29 DIAGNOSIS — E669 Obesity, unspecified: Secondary | ICD-10-CM | POA: Diagnosis not present

## 2023-10-29 DIAGNOSIS — Z9189 Other specified personal risk factors, not elsewhere classified: Secondary | ICD-10-CM

## 2023-10-29 DIAGNOSIS — Z820 Family history of epilepsy and other diseases of the nervous system: Secondary | ICD-10-CM

## 2023-10-29 DIAGNOSIS — R0683 Snoring: Secondary | ICD-10-CM | POA: Diagnosis not present

## 2023-10-29 NOTE — Progress Notes (Signed)
 Subjective:    Patient ID: David Reid is a 33 y.o. male.  HPI    True Mar, MD, PhD [facility] [address], [address] [address] [place], [place] [address]  Dear Dr. Kennyth,  I saw your patient, David Reid, upon your kind request in my sleep clinic today for initial consultation of his sleep disorder, in particular, concern for underlying apnea.  The patient is unaccompanied today.  As you know, David Reid is a 33 year old male with an underlying medical history of hypertension, PE, and obesity, who reports snoring and excessive daytime somnolence sleep disruption and witnessed apneas per significant other.  His Epworth sleepiness score is 19 out of 24, fatigue severity score is 27 out of 63.  His symptoms have become worse over time.  He has also gained weight since 3 to 4 years ago, mostly at the beginning of the pandemic but has maintained since then.  He is working on weight loss.  His mom has sleep apnea and uses a PAP machine.  He works 2 jobs, currently full-time with [employer], office-based, some from home.  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  Bedtime is generally between 11 and midnight or 2 AM when he comes home from his second job and rise time is around 8.  He has no nightly nocturia and no recurrent morning or nocturnal headaches.  He makes gasping sounds.  He drinks no daily caffeine but does drink alcohol weekly, typically on the weekends, 12-15 servings, typically beer.  He does not smoke.  He lives with his significant other, they have no children, he has 1 dog in the household and the dog does not typically end up sleeping in the bedroom typically not on the bed.  They have a TV in the bedroom but it is typically not on at night.  I reviewed your office note from 09/03/2023.  His Past Medical History Is Significant For: Past Medical History:  Diagnosis Date   Blood clotting disorder (HCC)    High blood  pressure    Pulmonary embolism (HCC)     His Past Surgical History Is Significant For: Past Surgical History:  Procedure Laterality Date   WISDOM TOOTH EXTRACTION      His Family History Is Significant For: Family History  Problem Relation Age of Onset   Diabetes Mother    Sleep apnea Mother    Hypertension Father    Sleep apnea Maternal Grandfather     His Social History Is Significant For: Social History   Socioeconomic History   Marital status: Significant Other    Spouse name: Not on file   Number of children: Not on file   Years of education: Not on file   Highest education level: Bachelor's degree (e.g., BA, AB, BS)  Occupational History   Not on file  Tobacco Use   Smoking status: Never   Smokeless tobacco: Never  Vaping Use   Vaping status: Never Used  Substance and Sexual Activity   Alcohol use: Yes    Alcohol/week: 12.0 - 15.0 standard drinks of alcohol    Types: 12 - 15 Standard drinks or equivalent per week    Comment: usually beer, probably a mixture 1-2 days per week   Drug use: Never   Sexual activity: Yes    Partners: Female  Other Topics Concern   Not on file  Social History Narrative  Caffeine: none   Right handed   Social Drivers of Corporate Investment Banker Strain: Low Risk  (08/27/2023)   Overall Financial Resource Strain (CARDIA)    Difficulty of Paying Living Expenses: Not very hard  Food Insecurity: No Food Insecurity (08/27/2023)   Hunger Vital Sign    Worried About Running Out of Food in the Last Year: Never true    Ran Out of Food in the Last Year: Never true  Transportation Needs: No Transportation Needs (08/27/2023)   PRAPARE - Administrator, Civil Service (Medical): No    Lack of Transportation (Non-Medical): No  Physical Activity: Insufficiently Active (08/27/2023)   Exercise Vital Sign    Days of Exercise per Week: 3 days    Minutes of Exercise per Session: 30 min  Stress: Stress Concern Present (08/27/2023)    [employer] of Occupational Health - Occupational Stress Questionnaire    Feeling of Stress : To some extent  Social Connections: Moderately Isolated (08/27/2023)   Social Connection and Isolation Panel [NHANES]    Frequency of Communication with Friends and Family: Three times a week    Frequency of Social Gatherings with Friends and Family: Twice a week    Attends Religious Services: Never    Database Administrator or Organizations: No    Attends Engineer, Structural: Not on file    Marital Status: Living with partner    His Allergies Are:  No Known Allergies:   His Current Medications Are:  Outpatient Encounter Medications as of 10/29/2023  Medication Sig   amLODipine  (NORVASC ) 10 MG tablet Take 1 tablet (10 mg total) by mouth daily.   Multiple Vitamin (MULTIVITAMIN WITH MINERALS) TABS tablet Take 1 tablet by mouth daily.   No facility-administered encounter medications on file as of 10/29/2023.  :   Review of Systems:  Out of a complete 14 point review of systems, all are reviewed and negative with the exception of these symptoms as listed below:  Review of Systems  Neurological:        Patient is here alone for sleep consult. He reports he does not get restful sleep, doesn't sleep for long periods, snores loudly, feels tired during the day, and stops breathing in his sleep. His mother has sleep apnea and uses a cpap machine. ESS 19    Objective:  Neurological Exam  Physical Exam Physical Examination:   Vitals:   10/29/23 0906  BP: (!) 138/94  Pulse: 79    General Examination: The patient is a very pleasant 33 y.o. male in no acute distress. He appears well-developed and well-nourished and well groomed.   HEENT: Normocephalic, atraumatic, pupils are equal, round and reactive to light, extraocular tracking is good without limitation to gaze excursion or nystagmus noted. Hearing is grossly intact. Face is symmetric with normal facial animation. Speech is  clear with no dysarthria noted. There is no hypophonia. There is no lip, neck/head, jaw or voice tremor. Neck is supple with full range of passive and active motion. There are no carotid bruits on auscultation. Oropharynx exam reveals: No significant mouth dryness, good dental hygiene, moderate airway crowding secondary to small airway entry, tonsillar size of about 1-2+, right side larger than left, Mallampati class III, tongue protrudes centrally and palate elevates symmetrically.  Neck circumference 17-5/8 inches, minimal overbite.   Chest: Clear to auscultation without wheezing, rhonchi or crackles noted.  Heart: S1+S2+0, regular and normal without murmurs, rubs or gallops noted.   Abdomen:  Soft, non-tender and non-distended.  Extremities: There is no pitting edema in the distal lower extremities bilaterally.   Skin: Warm and dry without trophic changes noted.   Musculoskeletal: exam reveals no obvious joint deformities.   Neurologically:  Mental status: The patient is awake, alert and oriented in all 4 spheres. His immediate and remote memory, attention, language skills and fund of knowledge are appropriate. There is no evidence of aphasia, agnosia, apraxia or anomia. Speech is clear with normal prosody and enunciation. Thought process is linear. Mood is normal and affect is normal.  Cranial nerves II - XII are as described above under HEENT exam.  Motor exam: Normal bulk, strength and tone is noted. There is no obvious action or resting tremor.  Fine motor skills and coordination: grossly intact.  Cerebellar testing: No dysmetria or intention tremor. There is no truncal or gait ataxia.  Sensory exam: intact to light touch in the upper and lower extremities.  Gait, station and balance: He stands easily. No veering to one side is noted. No leaning to one side is noted. Posture is age-appropriate and stance is narrow based. Gait shows normal stride length and normal pace. No problems turning  are noted.   Assessment and Plan:  In summary, David Reid is a very pleasant 33 year old male with an underlying medical history of hypertension, PE, and obesity, whose history and physical exam are concerning for sleep disordered breathing, particularly obstructive sleep apnea (OSA).  While a laboratory attended sleep study is typically considered gold standard for evaluation of sleep disordered breathing, we mutually agreed to proceed with a repeat sleep test at this time. I had a long chat with the patient about my findings and the diagnosis of sleep apnea, particularly OSA, its prognosis and treatment options. We talked about medical/conservative treatments, surgical interventions and non-pharmacological approaches for symptom control. I explained, in particular, the risks and ramifications of untreated moderate to severe OSA, especially with respect to developing cardiovascular disease down the road, including congestive heart failure (CHF), difficult to treat hypertension, cardiac arrhythmias (particularly A-fib), neurovascular complications including TIA, stroke and dementia. Even type 2 diabetes has, in part, been linked to untreated OSA. Symptoms of untreated OSA may include (but may not be limited to) daytime sleepiness, nocturia (i.e. frequent nighttime urination), memory problems, mood irritability and suboptimally controlled or worsening mood disorder such as depression and/or anxiety, lack of energy, lack of motivation, physical discomfort, as well as recurrent headaches, especially morning or nocturnal headaches. We talked about the importance of maintaining a healthy lifestyle and striving for healthy weight. In addition, we talked about the importance of striving for and maintaining good sleep hygiene. I recommended a sleep study at this time. I outlined the differences between a laboratory attended sleep study which is considered more comprehensive and accurate over the option of a home  sleep test (HST); the latter may lead to underestimation of sleep disordered breathing in some instances and does not help with diagnosing upper airway resistance syndrome and is not accurate enough to diagnose primary central sleep apnea typically. I outlined possible surgical and non-surgical treatment options of OSA, including the use of a positive airway pressure (PAP) device (i.e. CPAP, AutoPAP/APAP or BiPAP in certain circumstances), a custom-made dental device (aka oral appliance, which would require a referral to a specialist dentist or orthodontist typically, and is generally speaking not considered for patients with full dentures or edentulous state), upper airway surgical options, such as traditional UPPP (which is not considered  a first-line treatment) or the Inspire device (hypoglossal nerve stimulator, which would involve a referral for consultation with an ENT surgeon, after careful selection, following inclusion criteria - also not first-line treatment). I explained the PAP treatment option to the patient in detail, as this is generally considered first-line treatment.  The patient indicated that he would be willing to try PAP therapy, if the need arises. I explained the importance of being compliant with PAP treatment, not only for insurance purposes but primarily to improve patient's symptoms symptoms, and for the patient's long term health benefit, including to reduce His cardiovascular risks longer-term.    We will pick up our discussion about the next steps and treatment options after testing.  We will keep him posted as to the test results by phone call and/or MyChart messaging where possible.  We will plan to follow-up in sleep clinic accordingly as well.  I answered all his questions today and the patient was in agreement.   I encouraged him to call with any interim questions, concerns, problems or updates or email us  through MyChart.  Generally speaking, sleep test authorizations may  take up to 2 weeks, sometimes less, sometimes longer, the patient is encouraged to get in touch with us  if they do not hear back from the sleep lab staff directly within the next 2 weeks.  Thank you very much for allowing me to participate in the care of this nice patient. If I can be of any further assistance to you please do not hesitate to call me at [phone].  Sincerely,   True Mar, MD, PhD

## 2023-10-29 NOTE — Patient Instructions (Signed)

## 2023-11-06 ENCOUNTER — Telehealth: Payer: Self-pay | Admitting: Neurology

## 2023-11-06 NOTE — Telephone Encounter (Signed)
 HST [employer] all savers pending

## 2023-11-11 NOTE — Telephone Encounter (Signed)
HST [employer] All savers auth: [id number] (exp. 11/06/23 to 01/14/24)

## 2023-11-13 ENCOUNTER — Encounter: Payer: Self-pay | Admitting: Neurology

## 2023-11-20 ENCOUNTER — Ambulatory Visit: Payer: No Typology Code available for payment source | Admitting: Neurology

## 2023-11-20 DIAGNOSIS — G4733 Obstructive sleep apnea (adult) (pediatric): Secondary | ICD-10-CM | POA: Diagnosis not present

## 2023-11-20 DIAGNOSIS — G4719 Other hypersomnia: Secondary | ICD-10-CM

## 2023-11-20 DIAGNOSIS — R0681 Apnea, not elsewhere classified: Secondary | ICD-10-CM

## 2023-11-20 DIAGNOSIS — R0683 Snoring: Secondary | ICD-10-CM

## 2023-11-20 DIAGNOSIS — Z820 Family history of epilepsy and other diseases of the nervous system: Secondary | ICD-10-CM

## 2023-11-20 DIAGNOSIS — E669 Obesity, unspecified: Secondary | ICD-10-CM

## 2023-11-20 DIAGNOSIS — Z9189 Other specified personal risk factors, not elsewhere classified: Secondary | ICD-10-CM

## 2023-11-25 NOTE — Progress Notes (Unsigned)
 Marland Kitchen

## 2023-11-27 NOTE — Addendum Note (Signed)
Addended by: Huston Foley on: 11/27/2023 06:13 PM   Modules accepted: Orders

## 2023-11-28 ENCOUNTER — Telehealth: Payer: Self-pay

## 2023-11-28 NOTE — Telephone Encounter (Signed)
-----   Message from True Mar sent at 11/27/2023  6:13 PM EST ----- Patient referred by PCP, seen by me on 10/29/2023, patient had HST on 11/21/2023.    Please call and notify the patient that the recent home sleep test showed obstructive sleep apnea. OSA is overall mild, but worth treating to see if he feels better after treatment. To that end I recommend treatment for this in the form of autoPAP, which means, that we don't have to bring him in for a sleep study with CPAP, but will let him try an autoPAP machine at home, through a DME company (of his choice, or as per insurance requirement). The DME representative will educate him on how to use the machine, how to put the mask on, etc. I have placed an order in the chart. Please send referral, talk to patient, send report to referring MD. We will need a FU in sleep clinic in about 2.-3 months post-PAP set up (which is usually an insurance-mandated appointment to monitor compliance), please arrange that with me or one of our NPs. Please also go over the need for compliance with treatment (including the insurance-imposed minimum compliance percentage). Thanks,   True Mar, MD, PhD [facility])

## 2023-11-28 NOTE — Telephone Encounter (Signed)
 The patient returned the call and I discussed his sleep study results with him. The patient agrees to try autopap therapy. He is ok with using [facility] (doesn't have preference). He will watch for a call from them within 1 week. The patient prefers to call us  back later to schedule his initial follow-up and he does understand the visit should occur between 30 and 90 days after setup. He also understands insurance requires he use the machine at least 4 hours each night. His questions were answered and he verbalized appreciation.  Referral sent to [facility]. Report sent to referring provider.

## 2023-11-28 NOTE — Telephone Encounter (Signed)
 [facility] 1ST ATTEMPT 11/27/22

## 2023-11-28 NOTE — Telephone Encounter (Signed)
 Zott, Linnell Fulling, Otilio Jefferson, RN; [place], [place] Got it Thank  You

## 2023-12-09 ENCOUNTER — Encounter: Payer: No Typology Code available for payment source | Admitting: Family Medicine

## 2023-12-11 ENCOUNTER — Encounter: Payer: No Typology Code available for payment source | Admitting: Family Medicine

## 2023-12-16 ENCOUNTER — Ambulatory Visit (INDEPENDENT_AMBULATORY_CARE_PROVIDER_SITE_OTHER): Payer: No Typology Code available for payment source | Admitting: Family Medicine

## 2023-12-16 VITALS — BP 137/86 | HR 73 | Temp 98.4°F | Ht 70.0 in | Wt 242.0 lb

## 2023-12-16 DIAGNOSIS — Z1159 Encounter for screening for other viral diseases: Secondary | ICD-10-CM

## 2023-12-16 DIAGNOSIS — I10 Essential (primary) hypertension: Secondary | ICD-10-CM

## 2023-12-16 DIAGNOSIS — G4733 Obstructive sleep apnea (adult) (pediatric): Secondary | ICD-10-CM

## 2023-12-16 DIAGNOSIS — Z0001 Encounter for general adult medical examination with abnormal findings: Secondary | ICD-10-CM

## 2023-12-16 DIAGNOSIS — Z131 Encounter for screening for diabetes mellitus: Secondary | ICD-10-CM | POA: Diagnosis not present

## 2023-12-16 DIAGNOSIS — Z13220 Encounter for screening for lipoid disorders: Secondary | ICD-10-CM

## 2023-12-16 LAB — LIPID PANEL
Cholesterol: 185 mg/dL (ref 0–200)
HDL: 49.5 mg/dL (ref >39.00)
LDL Cholesterol: 110 mg/dL — ABNORMAL HIGH (ref 0–99)
NonHDL: 135.37
Total CHOL/HDL Ratio: 4
Triglycerides: 125 mg/dL (ref 0.0–149.0)
VLDL: 25 mg/dL (ref 0.0–40.0)

## 2023-12-16 LAB — COMPREHENSIVE METABOLIC PANEL
ALT: 27 U/L (ref 0–53)
AST: 16 U/L (ref 0–37)
Albumin: 4.7 g/dL (ref 3.5–5.2)
Alkaline Phosphatase: 49 U/L (ref 39–117)
BUN: 18 mg/dL (ref 6–23)
CO2: 26 meq/L (ref 19–32)
Calcium: 9.5 mg/dL (ref 8.4–10.5)
Chloride: 105 meq/L (ref 96–112)
Creatinine, Ser: 0.98 mg/dL (ref 0.40–1.50)
GFR: 100.66 mL/min (ref >60.00)
Glucose, Bld: 100 mg/dL — ABNORMAL HIGH (ref 70–99)
Potassium: 4 meq/L (ref 3.5–5.1)
Sodium: 142 meq/L (ref 135–145)
Total Bilirubin: 0.7 mg/dL (ref 0.2–1.2)
Total Protein: 7.9 g/dL (ref 6.0–8.3)

## 2023-12-16 LAB — CBC
HCT: 47.3 % (ref 39.0–52.0)
Hemoglobin: 16.1 g/dL (ref 13.0–17.0)
MCHC: 34.1 g/dL (ref 30.0–36.0)
MCV: 88.5 fL (ref 78.0–100.0)
Platelets: 248 10*3/uL (ref 150.0–400.0)
RBC: 5.35 Mil/uL (ref 4.22–5.81)
RDW: 13.6 % (ref 11.5–15.5)
WBC: 5 10*3/uL (ref 4.0–10.5)

## 2023-12-16 LAB — TSH: TSH: 1.99 u[IU]/mL (ref 0.35–5.50)

## 2023-12-16 LAB — HEMOGLOBIN A1C: Hgb A1c MFr Bld: 5.1 % (ref 4.6–6.5)

## 2023-12-16 NOTE — Patient Instructions (Signed)
 It was very nice to see you today!  We will check blood work today.  Please continue to monitor blood pressure at home and let us know if it is persistently elevated.  Please keep up the great work with your diet and exercise.  Return in about 1 year (around 12/15/2024) for Annual Physical.   Take care, Dr [name]  PLEASE NOTE:  If you had any lab tests, please let us know if you have not heard back within a few days. You may see your results on mychart before we have a chance to review them but we will give you a call once they are reviewed by [place].   If we ordered any referrals today, please let us know if you have not heard from their office within the next week.   If you had any urgent prescriptions sent in today, please check with the pharmacy within an hour of our visit to make sure the prescription was transmitted appropriately.   Please try these tips to maintain a healthy lifestyle:  Eat at least 3 REAL meals and 1-2 snacks per day.  Aim for no more than 5 hours between eating.  If you eat breakfast, please do so within one hour of getting up.   Each meal should contain half fruits/vegetables, one quarter protein, and one quarter carbs (no bigger than a computer mouse)  Cut down on sweet beverages. This includes juice, soda, and sweet tea.   Drink at least 1 glass of water with each meal and aim for at least 8 glasses per day  Exercise at least 150 minutes every week.    Preventive Care 63-109 Years Old, Male Preventive care refers to lifestyle choices and visits with your health care provider that can promote health and wellness. Preventive care visits are also called wellness exams. What can I expect for my preventive care visit? Counseling During your preventive care visit, your health care provider may ask about your: Medical history, including: Past medical problems. Family medical history. Current health, including: Emotional well-being. Home life and relationship  well-being. Sexual activity. Lifestyle, including: Alcohol, nicotine or tobacco, and drug use. Access to firearms. Diet, exercise, and sleep habits. Safety issues such as seatbelt and bike helmet use. Sunscreen use. Work and work Astronomer. Physical exam Your health care provider may check your: Height and weight. These may be used to calculate your BMI (body mass index). BMI is a measurement that tells if you are at a healthy weight. Waist circumference. This measures the distance around your waistline. This measurement also tells if you are at a healthy weight and may help predict your risk of certain diseases, such as type 2 diabetes and high blood pressure. Heart rate and blood pressure. Body temperature. Skin for abnormal spots. What immunizations do I need?  Vaccines are usually given at various ages, according to a schedule. Your health care provider will recommend vaccines for you based on your age, medical history, and lifestyle or other factors, such as travel or where you work. What tests do I need? Screening Your health care provider may recommend screening tests for certain conditions. This may include: Lipid and cholesterol levels. Diabetes screening. This is done by checking your blood sugar (glucose) after you have not eaten for a while (fasting). Hepatitis B test. Hepatitis C test. HIV (human immunodeficiency virus) test. STI (sexually transmitted infection) testing, if you are at risk. Talk with your health care provider about your test results, treatment options, and if necessary,  the need for more tests. Follow these instructions at home: Eating and drinking  Eat a healthy diet that includes fresh fruits and vegetables, whole grains, lean protein, and low-fat dairy products. Drink enough fluid to keep your urine pale yellow. Take vitamin and mineral supplements as recommended by your health care provider. Do not drink alcohol if your health care provider tells  you not to drink. If you drink alcohol: Limit how much you have to 0-2 drinks a day. Know how much alcohol is in your drink. In the [place] one drink equals one 12 oz bottle of beer (355 mL), one 5 oz glass of wine (148 mL), or one 1 oz glass of hard liquor (44 mL). Lifestyle Brush your teeth every morning and night with fluoride toothpaste. Floss one time each day. Exercise for at least 30 minutes 5 or more days each week. Do not use any products that contain nicotine or tobacco. These products include cigarettes, chewing tobacco, and vaping devices, such as e-cigarettes. If you need help quitting, ask your health care provider. Do not use drugs. If you are sexually active, practice safe sex. Use a condom or other form of protection to prevent STIs. Find healthy ways to manage stress, such as: Meditation, yoga, or listening to music. Journaling. Talking to a trusted person. Spending time with friends and family. Minimize exposure to UV radiation to reduce your risk of skin cancer. Safety Always wear your seat belt while driving or riding in a vehicle. Do not drive: If you have been drinking alcohol. Do not ride with someone who has been drinking. If you have been using any mind-altering substances or drugs. While texting. When you are tired or distracted. Wear a helmet and other protective equipment during sports activities. If you have firearms in your house, make sure you follow all gun safety procedures. Seek help if you have been physically or sexually abused. What's next? Go to your health care provider once a year for an annual wellness visit. Ask your health care provider how often you should have your eyes and teeth checked. Stay up to date on all vaccines. This information is not intended to replace advice given to you by your health care provider. Make sure you discuss any questions you have with your health care provider. Document Revised: 04/05/2021 Document Reviewed:  04/05/2021 Elsevier Patient Education  2024 [employer].

## 2023-12-16 NOTE — Progress Notes (Signed)
 Chief Complaint:  David Reid is a 33 y.o. male who presents today for his annual comprehensive physical exam.    Assessment/Plan:  Chronic Problems Addressed Today: Essential hypertension Initially elevated but at goal on recheck.  He should continue to have improvement with his weight loss in addition to lifestyle interventions.  He also will be treating his OSA soon which should help as well.  Will continue amlodipine 10 mg daily.  He will monitor at home and let us know if persistently elevated.  OSA (obstructive sleep apnea) Recently diagnosed that was not been able to start CPAP as insurance will not pay for this.  He is waiting to see if he can acquire a used CPAP machine soon.  Hopefully this will help some with his blood pressure as well.  Preventative Healthcare: Check labs.   Patient Counseling(The following topics were reviewed and/or handout was given):  -Nutrition: Stressed importance of moderation in sodium/caffeine intake, saturated fat and cholesterol, caloric balance, sufficient intake of fresh fruits, vegetables, and fiber.  -Stressed the importance of regular exercise.   -Substance Abuse: Discussed cessation/primary prevention of tobacco, alcohol, or other drug use; driving or other dangerous activities under the influence; availability of treatment for abuse.   -Injury prevention: Discussed safety belts, safety helmets, smoke detector, smoking near bedding or upholstery.   -Sexuality: Discussed sexually transmitted diseases, partner selection, use of condoms, avoidance of unintended pregnancy and contraceptive alternatives.   -Dental health: Discussed importance of regular tooth brushing, flossing, and dental visits.  -Health maintenance and immunizations reviewed. Please refer to Health maintenance section.  Return to care in 1 year for next preventative visit.     Subjective:  HPI:  He has no acute complaints today.   Lifestyle Diet: Trying to cut down on  processed foods and sodium.  Exercise: Going to gym routinely.      12/16/2023   11:43 AM  Depression screen PHQ 2/9  Decreased Interest 0  Down, Depressed, Hopeless 0  PHQ - 2 Score 0    Health Maintenance Due  Topic Date Due   Hepatitis C Screening  Never done     ROS: Per HPI, otherwise a complete review of systems was negative.   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 90 tablet 3   Multiple Vitamin (MULTIVITAMIN WITH MINERALS) TABS tablet Take 1 tablet by mouth daily.     No current facility-administered medications for this visit.    Allergies-reviewed and updated No Known Allergies  Social History   Socioeconomic History   Marital status: Significant Other    Spouse name: Not on file  Number of children: Not on file   Years of education: Not on file   Highest education level: Bachelor's degree (e.g., BA, AB, BS)  Occupational History   Not on file  Tobacco Use   Smoking status: Never   Smokeless tobacco: Never  Vaping Use   Vaping status: Never Used  Substance and Sexual Activity   Alcohol use: Yes    Alcohol/week: 12.0 - 15.0 standard  drinks of alcohol    Types: 12 - 15 Standard drinks or equivalent per week    Comment: usually beer, "probably a mixture" 1-2 days per week   Drug use: Never   Sexual activity: Yes    Partners: Female  Other Topics Concern   Not on file  Social History Narrative   Caffeine: none   Right handed   Social Drivers of Health   Financial Resource Strain: Low Risk  (12/16/2023)   Overall Financial Resource Strain (CARDIA)    Difficulty of Paying Living Expenses: Not very hard  Food Insecurity: No Food Insecurity (12/16/2023)   Hunger Vital Sign    Worried About Running Out of Food in the Last Year: Never true    Ran Out of Food in the Last Year: Never true  Transportation Needs: No Transportation Needs (12/16/2023)   PRAPARE - Administrator, Civil Service (Medical): No    Lack of Transportation (Non-Medical): No  Physical Activity: Sufficiently Active (12/16/2023)   Exercise Vital Sign    Days of Exercise per Week: 4 days    Minutes of Exercise per Session: 40 min  Stress: Patient Declined (12/16/2023)   [employer] of Occupational Health - Occupational Stress Questionnaire    Feeling of Stress : Patient declined  Social Connections: Moderately Isolated (12/16/2023)   Social Connection and Isolation Panel [NHANES]    Frequency of Communication with Friends and Family: Three times a week    Frequency of Social Gatherings with Friends and Family: Once a week    Attends Religious Services: Never    Database administrator or Organizations: No    Attends Engineer, structural: Not on file    Marital Status: Living with partner        Objective:  Physical Exam: BP 137/86   Pulse 73   Temp 98.4 F (36.9 C) (Temporal)   Ht 5\' 10"  (1.778 m)   Wt 242 lb (109.8 kg)   SpO2 99%   BMI 34.72 kg/m   Body mass index is 34.72 kg/m. Wt Readings from Last 3 Encounters:  12/16/23 242 lb (109.8 kg)  10/29/23 257 lb (116.6 kg)  09/03/23 251 lb 12.8 oz (114.2 kg)    Gen: NAD, resting comfortably HEENT: TMs normal bilaterally. OP clear. No thyromegaly noted.  CV: RRR with no murmurs appreciated Pulm: NWOB, CTAB with no crackles, wheezes, or rhonchi GI: Normal bowel sounds present. Soft, Nontender, Nondistended. MSK: no edema, cyanosis, or clubbing noted Skin: warm, dry Neuro: CN2-12 grossly intact. Strength 5/5 in upper and lower extremities. Reflexes symmetric and intact bilaterally.  Psych: Normal affect and thought content     Sharde Gover M. [name], MD 12/16/2023 12:05 PM

## 2023-12-16 NOTE — Assessment & Plan Note (Signed)
 Initially elevated but at goal on recheck.  He should continue to have improvement with his weight loss in addition to lifestyle interventions.  He also will be treating his OSA soon which should help as well.  Will continue amlodipine 10 mg daily.  He will monitor at home and let us know if persistently elevated.

## 2023-12-16 NOTE — Assessment & Plan Note (Signed)
 Recently diagnosed that was not been able to start CPAP as insurance will not pay for this.  He is waiting to see if he can acquire a used CPAP machine soon.  Hopefully this will help some with his blood pressure as well.

## 2023-12-17 LAB — HEPATITIS C ANTIBODY: Hepatitis C Ab: NONREACTIVE

## 2023-12-19 ENCOUNTER — Encounter: Payer: Self-pay | Admitting: Family Medicine

## 2023-12-19 NOTE — Progress Notes (Signed)
 Cholesterol is a little bit elevated.  The rest of his labs are at goal.  Do not need to make any changes to treatment plan.  He should continue to work on diet and exercise and we can recheck again in a year or so.

## 2024-06-15 ENCOUNTER — Ambulatory Visit (INDEPENDENT_AMBULATORY_CARE_PROVIDER_SITE_OTHER): Admitting: Dermatology

## 2024-06-15 ENCOUNTER — Encounter: Payer: Self-pay | Admitting: Dermatology

## 2024-06-15 VITALS — BP 151/93 | HR 112

## 2024-06-15 DIAGNOSIS — L814 Other melanin hyperpigmentation: Secondary | ICD-10-CM

## 2024-06-15 DIAGNOSIS — D2262 Melanocytic nevi of left upper limb, including shoulder: Secondary | ICD-10-CM | POA: Diagnosis not present

## 2024-06-15 DIAGNOSIS — R61 Generalized hyperhidrosis: Secondary | ICD-10-CM | POA: Diagnosis not present

## 2024-06-15 DIAGNOSIS — D485 Neoplasm of uncertain behavior of skin: Secondary | ICD-10-CM

## 2024-06-15 DIAGNOSIS — D1801 Hemangioma of skin and subcutaneous tissue: Secondary | ICD-10-CM

## 2024-06-15 DIAGNOSIS — L578 Other skin changes due to chronic exposure to nonionizing radiation: Secondary | ICD-10-CM

## 2024-06-15 DIAGNOSIS — L821 Other seborrheic keratosis: Secondary | ICD-10-CM

## 2024-06-15 DIAGNOSIS — W908XXA Exposure to other nonionizing radiation, initial encounter: Secondary | ICD-10-CM

## 2024-06-15 DIAGNOSIS — Z1283 Encounter for screening for malignant neoplasm of skin: Secondary | ICD-10-CM

## 2024-06-15 DIAGNOSIS — D492 Neoplasm of unspecified behavior of bone, soft tissue, and skin: Secondary | ICD-10-CM

## 2024-06-15 DIAGNOSIS — D2271 Melanocytic nevi of right lower limb, including hip: Secondary | ICD-10-CM

## 2024-06-15 DIAGNOSIS — L918 Other hypertrophic disorders of the skin: Secondary | ICD-10-CM

## 2024-06-15 DIAGNOSIS — L74519 Primary focal hyperhidrosis, unspecified: Secondary | ICD-10-CM

## 2024-06-15 DIAGNOSIS — L649 Androgenic alopecia, unspecified: Secondary | ICD-10-CM

## 2024-06-15 DIAGNOSIS — D229 Melanocytic nevi, unspecified: Secondary | ICD-10-CM

## 2024-06-15 DIAGNOSIS — D239 Other benign neoplasm of skin, unspecified: Secondary | ICD-10-CM

## 2024-06-15 MED ORDER — GLYCOPYRROLATE 1.5 MG PO TABS: 1.0000 | ORAL_TABLET | Freq: Every day | ORAL | 6 refills | Status: AC

## 2024-06-15 NOTE — Patient Instructions (Addendum)
 Date: Mon Jun 15 2024  Hello,  Thank you for visiting today. Here is a summary of the key instructions:  - Medications:   - Start taking glycopyrrolate  for excessive sweating   - Begin with one tablet once a day   - If needed, increase to twice a day   - Stop and call if you have dry mouth, urinary retention, or palpitations  - Skin Care:   - Use Drysol (aluminum chloride) on hands at night for sweating   - Apply The Ordinary caffeine serum under eyes for dark circles   - Use RoC Retinol Eye Cream under eyes to prevent sagging   - Try Plexiderm for temporary tightening of under-eye area  - Treatment Areas:   - Shave biopsy of 5mm irregular mole on right posterior shoulder  - Follow-up:   - Schedule appointment in 8-12 weeks to check on sweating medication  Please reach out if you have any questions or concerns.  Warm regards,  Dr. [facility]       Important Information   Due to recent changes in healthcare laws, you may see results of your pathology and/or laboratory studies on MyChart before the doctors have had a chance to review them. We understand that in some cases there may be results that are confusing or concerning to you. Please understand that not all results are received at the same time and often the doctors may need to interpret multiple results in order to provide you with the best plan of care or course of treatment. Therefore, we ask that you please give us  2 business days to thoroughly review all your results before contacting the office for clarification. Should we see a critical lab result, you will be contacted sooner.     If You Need Anything After Your Visit   If you have any questions or concerns for your doctor, please call our main line at [phone]. If no one answers, please leave a voicemail as directed and we will return your call as soon as possible. Messages left after 4 pm will be answered the following business day.     You may also send us  a message via MyChart. We typically respond to MyChart messages within 1-2 business days.  For prescription refills, please ask your pharmacy to contact our office. Our fax number is [phone].  If you have an urgent issue when the clinic is closed that cannot wait until the next business day, you can page your doctor at the number below.     Please note that while we do our best to be available for urgent issues outside of office hours, we are not available 24/7.    If you have an urgent issue and are unable to reach us , you may choose to seek medical care at your doctor's office, retail clinic, urgent care center, or emergency room.   If you have a medical emergency, please immediately call 911 or go to the emergency department. In the event of inclement weather, please call our main line at [phone] for an update on the status of any delays or closures.  Dermatology Medication Tips: Please keep the boxes that topical medications come in in order to help keep track of the instructions about where and how to use these. Pharmacies typically print the medication instructions only on the boxes and not directly on the medication tubes.   If your medication is too expensive, please contact our office at [phone] or send us  a  message through MyChart.    We are unable to tell what your co-pay for medications will be in advance as this is different depending on your insurance coverage. However, we may be able to find a substitute medication at lower cost or fill out paperwork to get insurance to cover a needed medication.    If a prior authorization is required to get your medication covered by your insurance company, please allow us  1-2 business days to complete this process.   Drug prices often vary depending on where the prescription is filled and some pharmacies may offer cheaper prices.   The website www.goodrx.com contains coupons for medications through  different pharmacies. The prices here do not account for what the cost may be with help from insurance (it may be cheaper with your insurance), but the website can give you the price if you did not use any insurance.  - You can print the associated coupon and take it with your prescription to the pharmacy.  - You may also stop by our office during regular business hours and pick up a GoodRx coupon card.  - If you need your prescription sent electronically to a different pharmacy, notify our office through [facility] or by phone at [phone]

## 2024-06-15 NOTE — Progress Notes (Signed)
 New Patient Visit   Subjective  David Reid is a 33 y.o. male who presents for the following:  Total Body Skin Exam (TBSE)  Patient present today for new patient visit for TBSE.The patient reports she has spots, moles and lesions to be evaluated, some may be new or changing and the patient may have concern these could be cancer. Patient has previously been treated by a dermatologist.Patient reports he  does not have hx of bx. Patient denies family history of skin cancers. Patient reports throughout his lifetime has had moderate sun exposure. Currently, patient reports if he  has excessive sun exposure, he does apply sunscreen and/or wears protective coverings.  The following portions of the chart were reviewed this encounter and updated as appropriate: medications, allergies, medical history  Review of Systems:  No other skin or systemic complaints except as noted in HPI or Assessment and Plan.  Objective  Well appearing patient in no apparent distress; mood and affect are within normal limits.  A full examination was performed including scalp, head, eyes, ears, nose, lips, neck, chest, axillae, abdomen, back, buttocks, bilateral upper extremities, bilateral lower extremities, hands, feet, fingers, toes, fingernails, and toenails. All findings within normal limits unless otherwise noted below.        Relevant exam findings are noted in the Assessment and Plan.  Right posterior shoulder 5 mm irregular brown Macule  Assessment & Plan   LENTIGINES, SEBORRHEIC KERATOSES, HEMANGIOMAS - Benign normal skin lesions - Benign-appearing - Call for any changes  MELANOCYTIC NEVI - Tan-brown and/or pink-flesh-colored symmetric macules and papules - Benign appearing on exam today - Observation - Call clinic for new or changing moles - Recommend daily use of broad spectrum spf 30+ sunscreen to sun-exposed areas.   ACTINIC DAMAGE - Chronic condition, secondary to cumulative UV/sun exposure -  diffuse scaly erythematous macules with underlying dyspigmentation - Recommend daily broad spectrum sunscreen SPF 30+ to sun-exposed areas, reapply every 2 hours as needed.  - Staying in the shade or wearing long sleeves, sun glasses (UVA+UVB protection) and wide brim hats (4-inch brim around the entire circumference of the hat) are also recommended for sun protection.  - Call for new or changing lesions.  1. Irregular Pigmented Lesion - Assessment: 5 mm irregular brown macule on the right posterior shoulder identified as an ugly duckling lesion during full body skin examination. Given the irregular appearance and the patient's history of sun exposure, this lesion warrants further evaluation to rule out melanoma. - Plan:    Perform shave biopsy of the 5 mm irregular brown macule on the right posterior shoulder    Send specimen for histopathological examination    Inform patient of biopsy results once available    Follow up based on biopsy results  2. Benign Skin Lesions - Assessment: Multiple benign skin lesions identified during full body skin examination, including seborrheic keratoses on the stomach and leg, described as wisdom spots, a 3 mm melanocytic nevus on the posterior medial cap of the right lower leg, epidermal nevus, cherry angiomas, and skin tags. These lesions appear benign based on clinical examination and do not require intervention at this time. - Plan:    Continue monitoring of benign lesions, particularly the 3 mm melanocytic nevus on the right lower leg    Educate patient on signs of changes in existing moles or development of new suspicious lesions    Recommend regular self-skin examinations  3. Hyperhidrosis - Assessment: Patient reports long-standing excessive sweating affecting multiple  areas, including hands, feet, and entire body. Symptoms are significant enough to impact daily activities and social interactions, including handshaking during job interviews. No  underlying medical conditions identified as potential causes of hyperhidrosis. - Plan:    Prescribe glycopyrrolate  1.5mg , starting with one tablet daily, increasing to twice daily if needed    Educate patient on potential side effects, including dry mouth, urinary retention, and palpitations    Recommend over-the-counter Drysol for nighttime application on hands as needed    If initial treatment is ineffective, consider advancing to other treatment options  4. Periorbital Hollowing - Assessment: Patient reports concerns about dark under-eye appearance. Clinical examination reveals periorbital hollowing rather than true bags. The darkness is likely due to vascular congestion and shadowing effects. - Plan:    Recommend The Ordinary Caffeine Serum for under-eye application to address vascular congestion    Advise use of RoC Retinol Eye Cream to stimulate collagen production and prevent further sagging    Suggest Plexiderm as a temporary solution for special occasions to improve appearance    Educate patient on the gradual onset of effects and the importance of consistent use  5. Early-Stage Androgenetic Alopecia - Assessment: Clinical observation suggests early-stage hair thinning, consistent with androgenetic alopecia. Patient expresses concern about hair loss progression. - Plan:    Recommend scheduling a separate, dedicated visit to discuss hair thinning concerns and treatment options in detail    Emphasize the importance of early intervention for better outcomes in hair retention  6. Skin Cancer Prevention - Assessment: Patient reports a history of easy sunburns despite consistent sunscreen use, indicating a higher risk for skin cancer development. - Plan:    Reinforce importance of continued sun protection measures, including proper sunscreen application and reapplication    Educate on additional sun protective behaviors    Recommend annual full-body skin examinations for ongoing  surveillance  Follow-up in 8-12 weeks to assess treatment efficacy and adjust as necessary.  SKIN CANCER SCREENING PERFORMED TODAY NEOPLASM OF UNCERTAIN BEHAVIOR OF SKIN Right posterior shoulder Epidermal / dermal shaving  Lesion diameter (cm):  0.5 Informed consent: discussed and consent obtained   Timeout: patient name, date of birth, surgical site, and procedure verified   Procedure prep:  Patient was prepped and draped in usual sterile fashion Prep type:  Isopropyl alcohol Anesthesia: the lesion was anesthetized in a standard fashion   Anesthetic:  1% lidocaine w/ epinephrine 1-100,000 buffered w/ 8.4% NaHCO3 Instrument used: DermaBlade   Hemostasis achieved with: pressure and aluminum chloride   Outcome: patient tolerated procedure well   Post-procedure details: sterile dressing applied and wound care instructions given   Dressing type: petrolatum   Additional details:  Pt aware that benign results will be sent to mychart and the staff will call abnormal results will  Specimen 1 - Surgical pathology Differential Diagnosis: R/O DN Vs other  Check Margins: No  Return in about 1 year (around 06/15/2025) for TBSE.  I, Jetta Ager, am acting as Neurosurgeon for [employer], DO.  Documentation: I have reviewed the above documentation for accuracy and completeness, and I agree with the above.  Delon Lenis, DO

## 2024-06-18 LAB — SURGICAL PATHOLOGY

## 2024-06-23 ENCOUNTER — Ambulatory Visit: Payer: Self-pay | Admitting: Dermatology

## 2024-06-23 ENCOUNTER — Encounter: Payer: Self-pay | Admitting: Dermatology

## 2024-06-23 DIAGNOSIS — D239 Other benign neoplasm of skin, unspecified: Secondary | ICD-10-CM | POA: Insufficient documentation

## 2024-06-23 NOTE — Progress Notes (Signed)
 Hi David Reid,  Please call pt and notify that their bx results showed an abnormal mole that requires a full excision in office with Dr Caralyn Guile

## 2024-07-07 ENCOUNTER — Encounter: Admitting: Dermatology

## 2024-09-01 ENCOUNTER — Encounter: Payer: Self-pay | Admitting: Dermatology

## 2024-09-01 ENCOUNTER — Ambulatory Visit (INDEPENDENT_AMBULATORY_CARE_PROVIDER_SITE_OTHER): Admitting: Dermatology

## 2024-09-01 VITALS — BP 150/98

## 2024-09-01 DIAGNOSIS — D239 Other benign neoplasm of skin, unspecified: Secondary | ICD-10-CM

## 2024-09-01 DIAGNOSIS — I10 Essential (primary) hypertension: Secondary | ICD-10-CM

## 2024-09-01 NOTE — Progress Notes (Signed)
 Follow-Up Visit   Subjective  David Reid is a 33 y.o. male who presents for the following: Excision of a DN with severe atypia on the right posterior shoulder. Biopsy was performed by Erminio Like, PA-C on 06/15/2024.  The following portions of the chart were reviewed this encounter and updated as appropriate: medications, allergies, medical history  Review of Systems:  No other skin or systemic complaints except as noted in HPI or Assessment and Plan.  Objective  Well appearing patient in no apparent distress; mood and affect are within normal limits.  A focused examination was performed of the following areas: Right posterior shoulder Relevant physical exam findings are noted in the Assessment and Plan.     Assessment & Plan   Elevated Blood Pressure Automatic wrist blood pressure cuff used to obtain blood pressure on 159/104 at 11:14am. A manual blood pressure followed on the right arm that was 160/98. Following the procedure a manual blood pressure was obtained on the right arm that was 150/98.  Patient took him amlodipine  last night.   Treatment Plan: Patient advised to contact him PCP and discuss elevated blood pressure readings. We encouraged the patient to obtained a blood pressure cuff that can be used to monitor his blood pressure at home.   DYSPLASTIC NEVUS Right Shoulder - Posterior Skin excision - Right Shoulder - Posterior  Excision method:  elliptical Lesion length (cm):  1.1 Lesion width (cm):  1 Margin per side (cm):  0.5 Total excision diameter (cm):  2.1 Informed consent: discussed and consent obtained   Timeout: patient name, date of birth, surgical site, and procedure verified   Procedure prep:  Patient was prepped and draped in usual sterile fashion Prep type:  Chlorhexidine Instrument used: #15 blade   Hemostasis achieved with: suture, pressure and electrodesiccation   Outcome: patient tolerated procedure well with no complications    Post-procedure details: sterile dressing applied and wound care instructions given   Dressing type: bandage and pressure dressing    Skin repair - Right Shoulder - Posterior Complexity:  Complex Final length (cm):  5 Informed consent: discussed and consent obtained   Timeout: patient name, date of birth, surgical site, and procedure verified   Procedure prep:  Patient was prepped and draped in usual sterile fashion Prep type:  Chlorhexidine Anesthesia: the lesion was anesthetized in a standard fashion   Anesthetic:  1% lidocaine w/ epinephrine 1-100,000 buffered w/ 8.4% NaHCO3 Reason for type of repair: reduce the risk of dehiscence, infection, and necrosis, allow closure of the large defect, preserve normal anatomy and avoid adjacent structures   Undermining: area extensively undermined   Subcutaneous layers (deep stitches):  Suture size:  3-0 Suture type: PDS (polydioxanone)   Stitches:  Buried vertical mattress Fine/surface layer approximation (top stitches):  Suture type: cyanoacrylate tissue glue   Hemostasis achieved with: suture, pressure and electrodesiccation Outcome: patient tolerated procedure well with no complications   Post-procedure details: sterile dressing applied and wound care instructions given   Dressing type: pressure dressing and bandage (Steri Strips)    Specimen 1 - Surgical pathology Differential Diagnosis: DN [id number] Check Margins: No HYPERTENSION, UNSPECIFIED TYPE    The surgical wound was then cleaned, prepped, and re-anesthetized as above. Wound edges were undermined extensively along at least one entire edge and at a distance equal to or greater than the width of the defect (see wound defect size above) in order to achieve closure and decrease wound tension and anatomic distortion. Redundant tissue repair  including standing cone removal was performed. Hemostasis was achieved with electrocautery. Subcutaneous and epidermal tissues were  approximated with the above sutures. The surgical site was then lightly scrubbed with sterile, saline-soaked gauze. Steri-strips were applied, and the area was then bandaged using Vaseline ointment, non-adherent gauze, gauze pads, and tape to provide an adequate pressure dressing. The patient tolerated the procedure well, was given detailed written and verbal wound care instructions, and was discharged in good condition.   The patient will follow-up: PRN.  Return if symptoms worsen or fail to improve. LILLETTE Rollene Gobble, RN, am acting as scribe for RUFUS CHRISTELLA HOLY, MD .   Documentation: I have reviewed the above documentation for accuracy and completeness, and I agree with the above.  RUFUS CHRISTELLA HOLY, MD

## 2024-09-01 NOTE — Patient Instructions (Signed)

## 2024-09-02 LAB — SURGICAL PATHOLOGY

## 2024-09-03 ENCOUNTER — Ambulatory Visit: Payer: Self-pay | Admitting: Dermatology

## 2024-09-25 ENCOUNTER — Other Ambulatory Visit: Payer: Self-pay

## 2024-09-25 MED ORDER — AMLODIPINE BESYLATE 10 MG PO TABS: 10.0000 mg | ORAL_TABLET | Freq: Every day | ORAL | 3 refills | Status: AC

## 2024-11-23 ENCOUNTER — Ambulatory Visit: Admitting: Dermatology

## 2024-12-17 ENCOUNTER — Encounter: Payer: No Typology Code available for payment source | Admitting: Family Medicine

## 2024-12-24 ENCOUNTER — Ambulatory Visit: Admitting: Dermatology

## 2025-06-15 ENCOUNTER — Ambulatory Visit: Admitting: Dermatology
